# Patient Record
Sex: Male | Born: 1970 | Race: White | Hispanic: No | Smoking: Former smoker
Health system: Southern US, Community
[De-identification: ages and names within clinical notes are randomized; demographics above are authoritative.]

## PROBLEM LIST (undated history)

## (undated) DIAGNOSIS — Z720 Tobacco use: Secondary | ICD-10-CM

## (undated) DIAGNOSIS — E785 Hyperlipidemia, unspecified: Secondary | ICD-10-CM

## (undated) HISTORY — DX: Tobacco use: Z72.0

## (undated) HISTORY — PX: OTHER SURGICAL HISTORY: SHX169

## (undated) HISTORY — DX: Hyperlipidemia, unspecified: E78.5

---
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
## 1998-07-20 HISTORY — PX: OTHER SURGICAL HISTORY: SHX169

## 2006-07-20 HISTORY — PX: OTHER SURGICAL HISTORY: SHX169

## 2009-04-19 ENCOUNTER — Ambulatory Visit: Payer: Self-pay | Admitting: Internal Medicine

## 2009-04-19 DIAGNOSIS — J45909 Unspecified asthma, uncomplicated: Secondary | ICD-10-CM | POA: Insufficient documentation

## 2009-04-19 DIAGNOSIS — F17200 Nicotine dependence, unspecified, uncomplicated: Secondary | ICD-10-CM | POA: Insufficient documentation

## 2009-04-19 DIAGNOSIS — J309 Allergic rhinitis, unspecified: Secondary | ICD-10-CM | POA: Insufficient documentation

## 2009-04-19 DIAGNOSIS — E785 Hyperlipidemia, unspecified: Secondary | ICD-10-CM | POA: Insufficient documentation

## 2009-04-23 ENCOUNTER — Encounter: Payer: Self-pay | Admitting: Internal Medicine

## 2009-10-17 ENCOUNTER — Ambulatory Visit: Payer: Self-pay | Admitting: Internal Medicine

## 2009-10-17 LAB — CONVERTED CEMR LAB
Alkaline Phosphatase: 79 units/L (ref 39–117)
Basophils Relative: 0.5 % (ref 0.0–3.0)
Bilirubin, Direct: 0 mg/dL (ref 0.0–0.3)
CO2: 31 meq/L (ref 19–32)
Calcium: 9.7 mg/dL (ref 8.4–10.5)
Cholesterol: 204 mg/dL — ABNORMAL HIGH (ref 0–200)
Creatinine, Ser: 1 mg/dL (ref 0.4–1.5)
Eosinophils Absolute: 0.3 10*3/uL (ref 0.0–0.7)
GFR calc non Af Amer: 88.39 mL/min (ref >60)
HDL: 44.2 mg/dL (ref >39.00)
Lymphocytes Relative: 32.9 % (ref 12.0–46.0)
MCHC: 35 g/dL (ref 30.0–36.0)
Monocytes Relative: 7.3 % (ref 3.0–12.0)
Neutrophils Relative %: 55.2 % (ref 43.0–77.0)
RBC: 5.13 M/uL (ref 4.22–5.81)
Total CHOL/HDL Ratio: 5
Total Protein: 7.9 g/dL (ref 6.0–8.3)
Triglycerides: 127 mg/dL (ref 0.0–149.0)
VLDL: 25.4 mg/dL (ref 0.0–40.0)
WBC: 7.9 10*3/uL (ref 4.5–10.5)

## 2009-10-18 ENCOUNTER — Telehealth: Payer: Self-pay

## 2010-05-19 ENCOUNTER — Ambulatory Visit: Payer: Self-pay | Admitting: Internal Medicine

## 2010-05-19 DIAGNOSIS — H547 Unspecified visual loss: Secondary | ICD-10-CM | POA: Insufficient documentation

## 2010-08-19 NOTE — Progress Notes (Signed)
Summary: Lab results  Phone Note Call from Patient Call back at [phone]   Caller: Patient Reason for Call: Talk to Nurse, Lab or Test Results Summary of Call: Patient wants latest lipid results.  Initial call taken by: Everrett Coombe,  October 18, 2009 9:52 AM  Follow-up for Phone Call        spoke with pt - reviewd labs , No futher questions. KIK Follow-up by: Duard Brady LPN,  October 18, 2009 10:26 AM

## 2010-08-19 NOTE — Assessment & Plan Note (Signed)
Summary: FU PER PT/PT WILL COME IN FASTING/NJR   Vital Signs:  Patient profile:   40 year old male Weight:      158 pounds Temp:     97.9 degrees F oral BP sitting:   120 / 80  (right arm) Cuff size:   regular  Vitals Entered By: Duard Brady LPN (October 17, 2009 7:59 AM) CC: f/u - fasting for lab , has other things to discuss with doctor Is Patient Diabetic? No   CC:  f/u - fasting for lab  and has other things to discuss with doctor.  History of Present Illness:  40 year old patient who has a history of dyslipidemia, asthma and allergic rhinitis.  He is a one pack per day smoker.  He has a history of premature cardiac disease with his father dying at age 35 of an acute MI.  He is interested in a smoking cessation program.  He is separated from his wife and her cats in his allergy situation has improved.  He still requires medication for symptom control.  His asthma has been stable  Preventive Screening-Counseling & Management  Alcohol-Tobacco     Smoking Status: current  Allergies: 1)  ! Penicillin G Procaine (Penicillin G Procaine)  Past History:  Past Medical History: Reviewed history from 04/19/2009 and no changes required. Allergic rhinitis Asthma Hyperlipidemia tobacco abuse  Past Surgical History: cosmetic facial surgery in 1990 ACL reconstructive surgery 2000 angle Hearne repair 2008 Peyronie's disease   Family History: Reviewed history from 04/19/2009 and no changes required. father died age 68.  Acute MI Paternal Aunt- CVD, DM mother, age 38, history of hypertension no siblings  Social History: Reviewed history from 04/19/2009 and no changes required. 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    Physical Exam  General:  Well-developed,well-nourished,in no acute distress; alert,appropriate and cooperative throughout examination Head:  Normocephalic and atraumatic without obvious abnormalities. No apparent alopecia or balding. Eyes:  No corneal or conjunctival inflammation noted. EOMI. Perrla. Funduscopic exam benign, without hemorrhages, exudates or papilledema. Vision grossly normal. Ears:  External ear exam shows no significant lesions or deformities.  Otoscopic examination reveals clear canals, tympanic membranes are intact bilaterally without bulging, retraction, inflammation or discharge. Hearing is grossly normal bilaterally. Mouth:  Oral mucosa and oropharynx without lesions or exudates.  Teeth in good repair. Neck:  No deformities, masses, or tenderness noted. Chest Wall:  No deformities, masses, tenderness or gynecomastia noted. Lungs:  Normal respiratory effort, chest expands symmetrically. Lungs are clear to auscultation, no crackles or wheezes. Heart:  Normal rate and regular rhythm. S1 and S2 normal without gallop, murmur, click, rub or other extra sounds. Abdomen:  Bowel sounds positive,abdomen soft and non-tender without masses, organomegaly or hernias noted. Msk:  No deformity or scoliosis noted of thoracic or lumbar spine.   Pulses:  R and L carotid,radial,femoral,dorsalis pedis and posterior tibial pulses are full and equal bilaterally Extremities:  No clubbing, cyanosis, edema, or deformity noted with normal full range of motion of all joints.   Skin:  Intact without suspicious lesions or  rashes Psych:  Cognition and  judgment appear intact. Alert and cooperative with normal attention span and concentration. No apparent delusions, illusions, hallucinations   Impression & Recommendations:  Problem # 1:  TOBACCO ABUSE (ICD-305.1)  His updated medication list for this problem includes:    Chantix Starting Month Pak 0.5 Mg X 11 & 1 Mg X 42 Tabs (Varenicline tartrate) .Marland Kitchen... As directed    Chantix 1 Mg Tabs (Varenicline tartrate) ..... One twice daily  His updated medication list for this problem includes:    Chantix Starting Month Pak 0.5 Mg X 11 & 1 Mg X 42 Tabs (Varenicline tartrate) .Marland Kitchen... As directed    Chantix 1 Mg Tabs (Varenicline tartrate) ..... One twice daily  Orders: TLB-BMP (Basic Metabolic Panel-BMET) (80048-METABOL) TLB-CBC Platelet - w/Differential (85025-CBCD) TLB-Hepatic/Liver Function Pnl (80076-HEPATIC)  Problem # 2:  HYPERLIPIDEMIA (ICD-272.4)  Orders: Venipuncture (03474) TLB-Lipid Panel (80061-LIPID) TLB-BMP (Basic Metabolic Panel-BMET) (80048-METABOL) TLB-CBC Platelet - w/Differential (85025-CBCD) TLB-Hepatic/Liver Function Pnl (80076-HEPATIC)  Problem # 3:  ASTHMA (ICD-493.90)  His updated medication list for this problem includes:    Singulair 10 Mg Tabs (Montelukast sodium) ..... One daily    Proair Hfa 108 (90 Base) Mcg/act Aers (Albuterol sulfate) .Marland Kitchen..Marland Kitchen Two puffs every 6 hours as needed for wheezing  His updated medication list for this problem includes:    Singulair 10 Mg Tabs (Montelukast sodium) ..... One daily    Proair Hfa 108 (90 Base) Mcg/act Aers (Albuterol sulfate) .Marland Kitchen..Marland Kitchen Two puffs every 6 hours as needed for wheezing  Orders: TLB-BMP (Basic Metabolic Panel-BMET) (80048-METABOL) TLB-CBC Platelet - w/Differential (85025-CBCD) TLB-Hepatic/Liver Function Pnl (80076-HEPATIC)  Problem # 4:  ALLERGIC RHINITIS (ICD-477.9)  Orders: TLB-BMP (Basic Metabolic Panel-BMET) (80048-METABOL) TLB-CBC Platelet - w/Differential (85025-CBCD) TLB-Hepatic/Liver Function Pnl  (80076-HEPATIC)  Complete Medication List: 1)  Singulair 10 Mg Tabs (Montelukast sodium) .... One daily 2)  Proair Hfa 108 (90 Base) Mcg/act Aers (Albuterol sulfate) .... Two puffs every 6 hours as needed for wheezing 3)  Chantix Starting Month Pak 0.5 Mg X 11 & 1 Mg X 42 Tabs (Varenicline tartrate) .... As directed 4)  Chantix 1 Mg Tabs (Varenicline tartrate) .... One twice daily  Other Orders: T-HIV Antibody  (Reflex) ([id number])  Patient Instructions: 1)  Please schedule a follow-up appointment in 1 year. 2)  It is important that you exercise regularly at least 20 minutes 5 times a week. If you develop chest pain, have severe difficulty breathing, or feel very tired , stop exercising immediately and seek medical attention. Prescriptions: CHANTIX 1 MG TABS (VARENICLINE TARTRATE) one twice daily  #60 x 2   Entered and Authorized by:   Gordy Savers  MD   Signed by:   Gordy Savers  MD on 10/17/2009   Method used:   Print then Give to Patient   RxID:   [id number] CHANTIX STARTING MONTH PAK 0.5 MG X 11 & 1 MG X 42 TABS (VARENICLINE TARTRATE) as directed  #one month x 0   Entered and Authorized by:   Gordy Savers  MD   Signed by:   Gordy Savers  MD on 10/17/2009   Method used:   Print then Give to Patient   RxID:   [id number] PROAIR HFA 108 (90 BASE) MCG/ACT AERS (ALBUTEROL SULFATE) two puffs every 6 hours as needed for wheezing  #3 x 6   Entered and Authorized by:   Gordy Savers  MD   Signed by:   Gordy Savers  MD on  10/17/2009   Method used:   Print then Give to Patient   RxID:   [id number] SINGULAIR 10 MG TABS (MONTELUKAST SODIUM) one daily  #90 x 6   Entered and Authorized by:   Gordy Savers  MD   Signed by:   Gordy Savers  MD on 10/17/2009   Method used:   Print then Give to Patient   RxID:   [id number]

## 2010-08-19 NOTE — Assessment & Plan Note (Signed)
Summary: consult re: eye issues/med check/flu shot/cjr   Vital Signs:  Patient profile:   40 year old male Weight:      185 pounds BMI:     28.23 Temp:     98.8 degrees F oral BP sitting:   132 / 94  (left arm) Cuff size:   regular  Vitals Entered By: Alfred Levins, CMA (May 19, 2010 9:59 AM) CC: floater, lt eye, renew meds   CC:  floater, lt eye, and renew meds.  History of Present Illness: 40 year old patient who has a history of erectile dysfunction, as well as asthma.  For the past week.  He has had some change in visual acuity or involving  the lateral aspect of the left visual field.  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  He states it is headache.  History of astigmatism, but has had no recent eye examination in some time  Preventive Screening-Counseling & Management  Alcohol-Tobacco     Smoking Cessation Counseling: yes  Current Medications (verified): 1)  Singulair 10 Mg Tabs (Montelukast Sodium) .... One Daily 2)  Proair Hfa 108 (90 Base) Mcg/act Aers (Albuterol Sulfate) .... Two Puffs Every 6 Hours As Needed For Wheezing  Allergies (verified): 1)  ! Penicillin G Procaine (Penicillin G Procaine)  Past History:  Past Medical History: Reviewed history from 04/19/2009 and no changes required. Allergic rhinitis Asthma Hyperlipidemia tobacco abuse  Review of Systems       The patient complains of vision loss.  The patient denies anorexia, fever, weight loss, weight gain, decreased hearing, hoarseness, chest pain, syncope, dyspnea on exertion, peripheral edema, prolonged cough, headaches, hemoptysis, abdominal pain, melena, hematochezia, severe indigestion/heartburn, hematuria, incontinence, genital sores, muscle weakness, suspicious skin lesions, transient blindness, difficulty walking, depression, unusual weight change, abnormal bleeding, enlarged lymph nodes, angioedema, breast masses, and testicular masses.    Physical Exam  General:   Well-developed,well-nourished,in no acute distress; alert,appropriate and cooperative throughout examination Head:  Normocephalic and atraumatic without obvious abnormalities. No apparent alopecia or balding. Eyes:  No corneal or conjunctival inflammation noted. EOMI. Perrla. Funduscopic exam benign, without hemorrhages, exudates or papilledema. Vision grossly normal.  visual acuity 2025 on the left and 2020 on the right   Impression & Recommendations:  Problem # 1:  TOBACCO ABUSE (ICD-305.1)  The following medications were removed from the medication list:    Chantix Starting Month Pak 0.5 Mg X 11 & 1 Mg X 42 Tabs (Varenicline tartrate) .Marland Kitchen... As directed    Chantix 1 Mg Tabs (Varenicline tartrate) ..... One twice daily  Problem # 2:  ASTHMA (ICD-493.90)  His updated medication list for this problem includes:    Singulair 10 Mg Tabs (Montelukast sodium) ..... One daily    Proair Hfa 108 (90 Base) Mcg/act Aers (Albuterol sulfate) .Marland Kitchen..Marland Kitchen Two puffs every 6 hours as needed for wheezing  Problem # 3:  UNSPECIFIED VISUAL LOSS (ICD-369.9)  Orders: Ophthalmology Referral (Ophthalmology)  Complete Medication List: 1)  Singulair 10 Mg Tabs (Montelukast sodium) .... One daily 2)  Proair Hfa 108 (90 Base) Mcg/act Aers (Albuterol sulfate) .... Two puffs every 6 hours as needed for wheezing 3)  Cialis 20 Mg Tabs (Tadalafil) .... One daily as directed  Other Orders: Admin 1st Vaccine (60454) Flu Vaccine 55yrs + [phone])  Patient Instructions: 1)  Tobacco is very bad for your health and your loved ones! You Should stop smoking!. 2)  ophthalmology evaluation as scheduled Prescriptions: PROAIR HFA 108 (90 BASE) MCG/ACT AERS (ALBUTEROL SULFATE) two  puffs every 6 hours as needed for wheezing  #3 x 6   Entered and Authorized by:   Gordy Savers  MD   Signed by:   Gordy Savers  MD on 05/19/2010   Method used:   Print then Give to Patient   RxID:   [id number] SINGULAIR 10 MG TABS  (MONTELUKAST SODIUM) one daily  #90 x 6   Entered and Authorized by:   Gordy Savers  MD   Signed by:   Gordy Savers  MD on 05/19/2010   Method used:   Print then Give to Patient   RxID:   [id number] CIALIS 20 MG TABS (TADALAFIL) one daily as directed  #12 x 6   Entered and Authorized by:   Gordy Savers  MD   Signed by:   Gordy Savers  MD on 05/19/2010   Method used:   Print then Give to Patient   RxID:   [id number]  Flu Vaccine Consent Questions     Do you have a history of severe allergic reactions to this vaccine? no    Any prior history of allergic reactions to egg and/or gelatin? no    Do you have a sensitivity to the preservative Thimersol? no    Do you have a past history of Guillan-Barre Syndrome? no    Do you currently have an acute febrile illness? no    Have you ever had a severe reaction to latex? no    Vaccine information given and explained to patient? yes    Are you currently pregnant? no    Lot Number:[id number]   Exp Date:01/17/2011   Site Given  Left Deltoid IM   Orders Added: 1)  Admin 1st Vaccine [90471] 2)  Flu Vaccine 55yrs + [01027] 3)  Est. Patient Level III [25366] 4)  Ophthalmology Referral [Ophthalmology]   .[email or web address]

## 2010-08-27 ENCOUNTER — Telehealth: Payer: Self-pay | Admitting: Internal Medicine

## 2010-08-27 DIAGNOSIS — Z720 Tobacco use: Secondary | ICD-10-CM

## 2010-08-27 NOTE — Telephone Encounter (Signed)
Wants a new rx for Chantix. Please send to [facility] [address]

## 2010-08-27 NOTE — Telephone Encounter (Signed)
Please advise 

## 2010-08-28 ENCOUNTER — Telehealth: Payer: Self-pay

## 2010-08-28 MED ORDER — VARENICLINE TARTRATE 1 MG PO TABS: 1.0000 mg | ORAL_TABLET | Freq: Two times a day (BID) | ORAL | Status: AC

## 2010-08-28 MED ORDER — VARENICLINE TARTRATE 0.5 MG X 11 & 1 MG X 42 PO MISC: ORAL | Status: AC

## 2010-08-28 NOTE — Telephone Encounter (Signed)
ok 

## 2010-08-29 NOTE — Telephone Encounter (Signed)
rx order done in other encounter

## 2011-03-24 ENCOUNTER — Ambulatory Visit (INDEPENDENT_AMBULATORY_CARE_PROVIDER_SITE_OTHER): Payer: BC Managed Care – PPO | Admitting: Internal Medicine

## 2011-03-24 DIAGNOSIS — Z0000 Encounter for general adult medical examination without abnormal findings: Secondary | ICD-10-CM

## 2011-03-24 DIAGNOSIS — Z23 Encounter for immunization: Secondary | ICD-10-CM

## 2011-05-20 ENCOUNTER — Other Ambulatory Visit: Payer: Self-pay | Admitting: Internal Medicine

## 2011-05-20 NOTE — Telephone Encounter (Signed)
Will need to be seen for future refills - last seen 04/2010

## 2011-06-01 ENCOUNTER — Ambulatory Visit (INDEPENDENT_AMBULATORY_CARE_PROVIDER_SITE_OTHER): Payer: BC Managed Care – PPO | Admitting: Internal Medicine

## 2011-06-01 DIAGNOSIS — Z0000 Encounter for general adult medical examination without abnormal findings: Secondary | ICD-10-CM

## 2011-06-01 DIAGNOSIS — Z23 Encounter for immunization: Secondary | ICD-10-CM

## 2011-08-07 ENCOUNTER — Other Ambulatory Visit: Payer: Self-pay | Admitting: Internal Medicine

## 2011-09-22 ENCOUNTER — Other Ambulatory Visit: Payer: Self-pay | Admitting: Internal Medicine

## 2011-09-24 ENCOUNTER — Other Ambulatory Visit: Payer: Self-pay | Admitting: Internal Medicine

## 2011-09-24 MED ORDER — MONTELUKAST SODIUM 10 MG PO TABS: 10.0000 mg | ORAL_TABLET | Freq: Every day | ORAL | Status: DC

## 2011-09-24 NOTE — Telephone Encounter (Signed)
Pt received only 15 pills singulair. Pt is unable to come in until 10-13-2011. Pt would like 15 more pills call into [facility] [address] [phone]

## 2011-10-13 ENCOUNTER — Encounter: Payer: Self-pay | Admitting: Internal Medicine

## 2011-10-13 ENCOUNTER — Ambulatory Visit (INDEPENDENT_AMBULATORY_CARE_PROVIDER_SITE_OTHER): Payer: BC Managed Care – PPO | Admitting: Internal Medicine

## 2011-10-13 VITALS — BP 120/82 | Temp 98.7°F | Ht 68.0 in | Wt 195.0 lb

## 2011-10-13 DIAGNOSIS — F17200 Nicotine dependence, unspecified, uncomplicated: Secondary | ICD-10-CM

## 2011-10-13 DIAGNOSIS — J309 Allergic rhinitis, unspecified: Secondary | ICD-10-CM

## 2011-10-13 DIAGNOSIS — J45909 Unspecified asthma, uncomplicated: Secondary | ICD-10-CM

## 2011-10-13 MED ORDER — MONTELUKAST SODIUM 10 MG PO TABS: 10.0000 mg | ORAL_TABLET | Freq: Every day | ORAL | Status: DC

## 2011-10-13 MED ORDER — ALBUTEROL SULFATE HFA 108 (90 BASE) MCG/ACT IN AERS: 2.0000 | INHALATION_SPRAY | Freq: Four times a day (QID) | RESPIRATORY_TRACT | Status: DC | PRN

## 2011-10-13 MED ORDER — BUDESONIDE 180 MCG/ACT IN AEPB: 1.0000 | INHALATION_SPRAY | Freq: Two times a day (BID) | RESPIRATORY_TRACT | Status: DC

## 2011-10-13 NOTE — Patient Instructions (Signed)
Smoking tobacco is very bad for your health. You should stop smoking immediately.    It is important that you exercise regularly, at least 20 minutes 3 to 4 times per week.  If you develop chest pain or shortness of breath seek  medical attention.  Pulmicort 2 puffs twice daily for one week then decrease to one puff twice daily

## 2011-10-13 NOTE — Progress Notes (Signed)
  Subjective:    Patient ID: Roy Griffin, male    DOB: January 04, 1971, 41 y.o.   MRN: [id number]  HPI  41 year old patient who has a history of asthma and allergic rhinitis as well as ongoing tobacco use. He uses one half pack of cigarettes daily. He is doing reasonably well. He remains on Singulair maintenance but does require albuterol twice a day for symptom control. He has some mild asthma 2 months ago associated with a mild viral URI. Today he feels well    Review of Systems  Constitutional: Negative for fever, chills, appetite change and fatigue.  HENT: Negative for hearing loss, ear pain, congestion, sore throat, trouble swallowing, neck stiffness, dental problem, voice change and tinnitus.   Eyes: Negative for pain, discharge and visual disturbance.  Respiratory: Positive for wheezing. Negative for cough, chest tightness and stridor.   Cardiovascular: Negative for chest pain, palpitations and leg swelling.  Gastrointestinal: Negative for nausea, vomiting, abdominal pain, diarrhea, constipation, blood in stool and abdominal distention.  Genitourinary: Negative for urgency, hematuria, flank pain, discharge, difficulty urinating and genital sores.  Musculoskeletal: Negative for myalgias, back pain, joint swelling, arthralgias and gait problem.  Skin: Negative for rash.  Neurological: Negative for dizziness, syncope, speech difficulty, weakness, numbness and headaches.  Hematological: Negative for adenopathy. Does not bruise/bleed easily.  Psychiatric/Behavioral: Negative for behavioral problems and dysphoric mood. The patient is not nervous/anxious.        Objective:   Physical Exam  Constitutional: He is oriented to person, place, and time. He appears well-developed.  HENT:  Head: Normocephalic.  Right Ear: External ear normal.  Left Ear: External ear normal.  Eyes: Conjunctivae and EOM are normal.  Neck: Normal range of motion.  Cardiovascular: Normal rate and normal heart sounds.     Pulmonary/Chest: Breath sounds normal.  Abdominal: Bowel sounds are normal.  Musculoskeletal: Normal range of motion. He exhibits no edema and no tenderness.  Neurological: He is alert and oriented to person, place, and time.  Psychiatric: He has a normal mood and affect. His behavior is normal.          Assessment & Plan:    Asthma. We'll continue Singulair but add Pulmicort and hopefully to decrease albuterol use to when necessary only. Cessation of smoking encouraged

## 2012-05-31 ENCOUNTER — Encounter: Payer: Self-pay | Admitting: Internal Medicine

## 2012-05-31 ENCOUNTER — Ambulatory Visit (INDEPENDENT_AMBULATORY_CARE_PROVIDER_SITE_OTHER): Payer: BC Managed Care – PPO | Admitting: Internal Medicine

## 2012-05-31 VITALS — BP 134/80 | HR 76 | Temp 98.4°F | Resp 16 | Wt 197.0 lb

## 2012-05-31 DIAGNOSIS — Z23 Encounter for immunization: Secondary | ICD-10-CM

## 2012-05-31 DIAGNOSIS — R109 Unspecified abdominal pain: Secondary | ICD-10-CM

## 2012-05-31 DIAGNOSIS — R1030 Lower abdominal pain, unspecified: Secondary | ICD-10-CM

## 2012-05-31 NOTE — Progress Notes (Signed)
  Subjective:    Patient ID: Roy Griffin, male    DOB: Jun 22, 1971, 41 y.o.   MRN: [id number]  HPI  41 year old patient who has a history of a prior right inguinal hernia repair. For the past the week he has had some discomfort in the right groin area. He also complains of discomfort over the coccyx region and the left buttock region that has been intermittent for the past 2 months    Review of Systems  Musculoskeletal: Positive for back pain.       Objective:   Physical Exam  Constitutional: He appears well-developed and well-nourished. No distress.  Abdominal: Soft. Bowel sounds are normal. He exhibits no distension and no mass. There is no tenderness. There is no rebound and no guarding.  Musculoskeletal: Normal range of motion. He exhibits no edema and no tenderness.          Assessment & Plan:   Right  inguinal pain pain in the low back and left buttock region. Suspect musculoligamentous. Patient will try anti-inflammatories and will observe. He was reassured. He'll call there is any worsening symptoms

## 2012-05-31 NOTE — Patient Instructions (Signed)
Take Aleve 200 mg twice daily for pain or swelling  Call or return to clinic prn if these symptoms worsen or fail to improve as anticipated.  

## 2012-08-29 ENCOUNTER — Other Ambulatory Visit: Payer: Self-pay | Admitting: Internal Medicine

## 2012-08-29 NOTE — Telephone Encounter (Signed)
Patient called stating that he would like a refill of his cialis 20mg  1poqd prn sent to [facility] on [address]. Please assist.

## 2012-08-30 MED ORDER — TADALAFIL 20 MG PO TABS: 10.0000 mg | ORAL_TABLET | ORAL | Status: DC | PRN

## 2012-11-18 ENCOUNTER — Other Ambulatory Visit: Payer: Self-pay | Admitting: Internal Medicine

## 2013-08-12 ENCOUNTER — Other Ambulatory Visit: Payer: Self-pay | Admitting: Internal Medicine

## 2013-10-15 ENCOUNTER — Other Ambulatory Visit: Payer: Self-pay | Admitting: Internal Medicine

## 2013-10-18 ENCOUNTER — Other Ambulatory Visit: Payer: Self-pay | Admitting: Internal Medicine

## 2013-11-13 ENCOUNTER — Encounter: Payer: Self-pay | Admitting: Internal Medicine

## 2013-11-13 ENCOUNTER — Ambulatory Visit (INDEPENDENT_AMBULATORY_CARE_PROVIDER_SITE_OTHER): Payer: 59 | Admitting: Internal Medicine

## 2013-11-13 VITALS — BP 122/90 | HR 79 | Temp 98.8°F | Resp 20 | Ht 68.0 in | Wt 196.0 lb

## 2013-11-13 DIAGNOSIS — E785 Hyperlipidemia, unspecified: Secondary | ICD-10-CM

## 2013-11-13 DIAGNOSIS — J45909 Unspecified asthma, uncomplicated: Secondary | ICD-10-CM

## 2013-11-13 DIAGNOSIS — F17200 Nicotine dependence, unspecified, uncomplicated: Secondary | ICD-10-CM

## 2013-11-13 MED ORDER — TADALAFIL 20 MG PO TABS: 10.0000 mg | ORAL_TABLET | ORAL | Status: DC | PRN

## 2013-11-13 MED ORDER — ALBUTEROL SULFATE HFA 108 (90 BASE) MCG/ACT IN AERS: INHALATION_SPRAY | RESPIRATORY_TRACT | Status: DC

## 2013-11-13 NOTE — Progress Notes (Signed)
Subjective:    Patient ID: Roy Griffin, male    DOB: June 06, 1971, 43 y.o.   MRN: [id number]  HPI 43 year old  patient has a history of allergic rhinitis and asthma.  He is a former smoker, but has been abstinent for about one year.  He has been on maintenance Singulair and Pulmicort in the past, but presently is using albuterol only.  He is a difficult spring with frequent albuterol use  Social history- will graduate school in approximately 2 weeks  Past Medical History  Diagnosis Date  . Allergic rhinitis   . Asthma   . Hyperlipidemia   . Tobacco abuse     History   Social History  . Marital Status: Married    Spouse Name: N/A    Number of Children: N/A  . Years of Education: N/A   Occupational History  . Not on file.   Social History Main Topics  . Smoking status: Current Every Day Smoker -- 0.50 packs/day    Types: Cigarettes  . Smokeless tobacco: Never Used  . Alcohol Use: Yes  . Drug Use: No  . Sexual Activity: Not on file   Other Topics Concern  . Not on file   Social History Narrative  . No narrative on file    Past Surgical History  Procedure Laterality Date  . Facial cosmetic surgery  1990  . Acl reconstructive surgery  2000  . Angle hearne repair  2008  . Peyronie's disease      Family History  Problem Relation Age of Onset  . Hypertension Mother   . Heart attack Father   . Diabetes Paternal Aunt     Allergies  Allergen Reactions  . Penicillins     No current outpatient prescriptions on file prior to visit.   No current facility-administered medications on file prior to visit.    BP 122/90  Pulse 79  Temp(Src) 98.8 F (37.1 C) (Oral)  Resp 20  Ht 5\' 8"  (1.727 m)  Wt 196 lb (88.905 kg)  BMI 29.81 kg/m2  SpO2 97%       Review of Systems  Constitutional: Negative for fever, chills, appetite change and fatigue.  HENT: Negative for congestion, dental problem, ear pain, hearing loss, sore throat, tinnitus, trouble swallowing and  voice change.   Eyes: Negative for pain, discharge and visual disturbance.  Respiratory: Positive for shortness of breath and wheezing. Negative for cough, chest tightness and stridor.   Cardiovascular: Negative for chest pain, palpitations and leg swelling.  Gastrointestinal: Negative for nausea, vomiting, abdominal pain, diarrhea, constipation, blood in stool and abdominal distention.  Genitourinary: Negative for urgency, hematuria, flank pain, discharge, difficulty urinating and genital sores.  Musculoskeletal: Negative for arthralgias, back pain, gait problem, joint swelling, myalgias and neck stiffness.  Skin: Negative for rash.  Neurological: Negative for dizziness, syncope, speech difficulty, weakness, numbness and headaches.  Hematological: Negative for adenopathy. Does not bruise/bleed easily.  Psychiatric/Behavioral: Negative for behavioral problems and dysphoric mood. The patient is not nervous/anxious.        Objective:   Physical Exam  Constitutional: He is oriented to person, place, and time. He appears well-developed.  HENT:  Head: Normocephalic.  Right Ear: External ear normal.  Left Ear: External ear normal.  Eyes: Conjunctivae and EOM are normal.  Neck: Normal range of motion.  Cardiovascular: Normal rate and normal heart sounds.   Pulmonary/Chest: Effort normal and breath sounds normal. He has no wheezes.  Abdominal: Bowel sounds are normal.  Musculoskeletal: Normal range of motion. He exhibits no edema and no tenderness.  Neurological: He is alert and oriented to person, place, and time.  Psychiatric: He has a normal mood and affect. His behavior is normal.          Assessment & Plan:   Allergic rhinitis Asthma  Medications refilled Will resume Pulmicort at least until early summer.  Schedule CPX

## 2013-11-13 NOTE — Progress Notes (Signed)
Pre-visit discussion using our clinic review tool. No additional management support is needed unless otherwise documented below in the visit note.  

## 2013-11-13 NOTE — Patient Instructions (Signed)
It is important that you exercise regularly, at least 20 minutes 3 to 4 times per week.  If you develop chest pain or shortness of breath seek  medical attention.  Return in one year for follow-up   

## 2013-11-14 ENCOUNTER — Telehealth: Payer: Self-pay | Admitting: Internal Medicine

## 2013-11-14 NOTE — Telephone Encounter (Signed)
Relevant patient education mailed to patient.  

## 2014-03-05 ENCOUNTER — Telehealth: Payer: Self-pay | Admitting: Internal Medicine

## 2014-03-05 MED ORDER — BUDESONIDE 180 MCG/ACT IN AEPB: 2.0000 | INHALATION_SPRAY | Freq: Two times a day (BID) | RESPIRATORY_TRACT | Status: DC

## 2014-03-05 NOTE — Telephone Encounter (Signed)
Please advise dosage of Pulmicort?

## 2014-03-05 NOTE — Telephone Encounter (Signed)
Pt was given a sample of ?pulmicort and needs new rx sent to [facility] [address]

## 2014-03-05 NOTE — Telephone Encounter (Signed)
Pt notified Rx sent to pharmacy

## 2014-03-05 NOTE — Telephone Encounter (Signed)
Pulmicort Flexhaler 180 2 puffs twice daily

## 2014-03-08 ENCOUNTER — Telehealth: Payer: Self-pay | Admitting: Internal Medicine

## 2014-03-08 MED ORDER — BECLOMETHASONE DIPROPIONATE 80 MCG/ACT IN AERS: 1.0000 | INHALATION_SPRAY | Freq: Two times a day (BID) | RESPIRATORY_TRACT | Status: DC

## 2014-03-08 NOTE — Telephone Encounter (Signed)
Please see message and advise 

## 2014-03-08 NOTE — Telephone Encounter (Signed)
I received a PA request for budesonide (PULMICORT) 180 MCG/ACT inhaler.  I spoke to pt's plan and was advised the pt must try and fail one of the following:  Asmanex, QVAR, or Flovent HFA

## 2014-03-08 NOTE — Telephone Encounter (Signed)
Left message on voicemail to call office.  

## 2014-03-08 NOTE — Telephone Encounter (Signed)
Qvar 80 one puff twice daily

## 2014-03-08 NOTE — Telephone Encounter (Signed)
Pt notified Rx for Qvar sent to pharmacy due to insurance would not cover Pulmicort.

## 2014-10-23 ENCOUNTER — Other Ambulatory Visit: Payer: 59

## 2014-10-30 ENCOUNTER — Encounter: Payer: 59 | Admitting: Internal Medicine

## 2014-11-16 ENCOUNTER — Other Ambulatory Visit: Payer: 59

## 2014-11-23 ENCOUNTER — Encounter: Payer: 59 | Admitting: Internal Medicine

## 2015-01-04 ENCOUNTER — Other Ambulatory Visit (INDEPENDENT_AMBULATORY_CARE_PROVIDER_SITE_OTHER): Payer: 59

## 2015-01-04 DIAGNOSIS — Z0000 Encounter for general adult medical examination without abnormal findings: Secondary | ICD-10-CM

## 2015-01-04 LAB — CBC WITH DIFFERENTIAL/PLATELET
BASOS PCT: 1 % (ref 0.0–3.0)
Basophils Absolute: 0.1 10*3/uL (ref 0.0–0.1)
EOS ABS: 0.4 10*3/uL (ref 0.0–0.7)
Eosinophils Relative: 7.7 % — ABNORMAL HIGH (ref 0.0–5.0)
HCT: 47.6 % (ref 39.0–52.0)
Hemoglobin: 16.2 g/dL (ref 13.0–17.0)
LYMPHS ABS: 1.9 10*3/uL (ref 0.7–4.0)
Lymphocytes Relative: 34.2 % (ref 12.0–46.0)
MCHC: 34 g/dL (ref 30.0–36.0)
MCV: 89.7 fl (ref 78.0–100.0)
Monocytes Absolute: 0.4 10*3/uL (ref 0.1–1.0)
Monocytes Relative: 7.6 % (ref 3.0–12.0)
Neutro Abs: 2.8 10*3/uL (ref 1.4–7.7)
Neutrophils Relative %: 49.5 % (ref 43.0–77.0)
Platelets: 240 10*3/uL (ref 150.0–400.0)
RBC: 5.31 Mil/uL (ref 4.22–5.81)
RDW: 12.8 % (ref 11.5–15.5)
WBC: 5.6 10*3/uL (ref 4.0–10.5)

## 2015-01-04 LAB — POCT URINALYSIS DIPSTICK
BILIRUBIN UA: NEGATIVE
Glucose, UA: NEGATIVE
Ketones, UA: NEGATIVE
Leukocytes, UA: NEGATIVE
Nitrite, UA: NEGATIVE
Protein, UA: NEGATIVE
RBC UA: NEGATIVE
SPEC GRAV UA: 1.01
Urobilinogen, UA: 0.2
pH, UA: 6

## 2015-01-04 LAB — BASIC METABOLIC PANEL
BUN: 11 mg/dL (ref 6–23)
CHLORIDE: 101 meq/L (ref 96–112)
CO2: 31 mEq/L (ref 19–32)
Calcium: 9.2 mg/dL (ref 8.4–10.5)
Creatinine, Ser: 1.04 mg/dL (ref 0.40–1.50)
GFR: 82.36 mL/min (ref >60.00)
Glucose, Bld: 92 mg/dL (ref 70–99)
POTASSIUM: 4.6 meq/L (ref 3.5–5.1)
SODIUM: 135 meq/L (ref 135–145)

## 2015-01-04 LAB — HEPATIC FUNCTION PANEL
ALT: 30 U/L (ref 0–53)
AST: 28 U/L (ref 0–37)
Albumin: 4.3 g/dL (ref 3.5–5.2)
Alkaline Phosphatase: 76 U/L (ref 39–117)
BILIRUBIN TOTAL: 0.4 mg/dL (ref 0.2–1.2)
Bilirubin, Direct: 0.1 mg/dL (ref 0.0–0.3)
TOTAL PROTEIN: 6.9 g/dL (ref 6.0–8.3)

## 2015-01-04 LAB — LIPID PANEL
CHOL/HDL RATIO: 7
Cholesterol: 230 mg/dL — ABNORMAL HIGH (ref 0–200)
HDL: 33 mg/dL — AB (ref >39.00)
LDL Cholesterol: 169 mg/dL — ABNORMAL HIGH (ref 0–99)
NONHDL: 197
TRIGLYCERIDES: 140 mg/dL (ref 0.0–149.0)
VLDL: 28 mg/dL (ref 0.0–40.0)

## 2015-01-04 LAB — TSH: TSH: 2.15 u[IU]/mL (ref 0.35–4.50)

## 2015-01-11 ENCOUNTER — Encounter: Payer: Self-pay | Admitting: Internal Medicine

## 2015-01-11 ENCOUNTER — Telehealth: Payer: Self-pay | Admitting: Internal Medicine

## 2015-01-11 ENCOUNTER — Ambulatory Visit (INDEPENDENT_AMBULATORY_CARE_PROVIDER_SITE_OTHER): Payer: 59 | Admitting: Internal Medicine

## 2015-01-11 VITALS — BP 120/76 | HR 86 | Temp 98.5°F | Resp 20 | Ht 67.5 in | Wt 191.0 lb

## 2015-01-11 DIAGNOSIS — Z0000 Encounter for general adult medical examination without abnormal findings: Secondary | ICD-10-CM

## 2015-01-11 DIAGNOSIS — J4520 Mild intermittent asthma, uncomplicated: Secondary | ICD-10-CM

## 2015-01-11 DIAGNOSIS — E785 Hyperlipidemia, unspecified: Secondary | ICD-10-CM

## 2015-01-11 MED ORDER — TRIAMCINOLONE ACETONIDE 0.1 % EX CREA: 1.0000 "application " | TOPICAL_CREAM | Freq: Two times a day (BID) | CUTANEOUS | Status: DC

## 2015-01-11 MED ORDER — BUDESONIDE 180 MCG/ACT IN AEPB: 2.0000 | INHALATION_SPRAY | Freq: Two times a day (BID) | RESPIRATORY_TRACT | Status: DC

## 2015-01-11 MED ORDER — TADALAFIL 20 MG PO TABS: 10.0000 mg | ORAL_TABLET | ORAL | Status: DC | PRN

## 2015-01-11 MED ORDER — ALBUTEROL SULFATE HFA 108 (90 BASE) MCG/ACT IN AERS: INHALATION_SPRAY | RESPIRATORY_TRACT | Status: DC

## 2015-01-11 NOTE — Patient Instructions (Signed)
It is important that you exercise regularly, at least 20 minutes 3 to 4 times per week.  If you develop chest pain or shortness of breath seek  medical attention.  Fat and Cholesterol Control Diet Fat and cholesterol levels in your blood and organs are influenced by your diet. High levels of fat and cholesterol may lead to diseases of the heart, small and large blood vessels, gallbladder, liver, and pancreas. CONTROLLING FAT AND CHOLESTEROL WITH DIET Although exercise and lifestyle factors are important, your diet is key. That is because certain foods are known to raise cholesterol and others to lower it. The goal is to balance foods for their effect on cholesterol and more importantly, to replace saturated and trans fat with other types of fat, such as monounsaturated fat, polyunsaturated fat, and omega-3 fatty acids. On average, a person should consume no more than 15 to 17 g of saturated fat daily. Saturated and trans fats are considered "bad" fats, and they will raise LDL cholesterol. Saturated fats are primarily found in animal products such as meats, butter, and cream. However, that does not mean you need to give up all your favorite foods. Today, there are good tasting, low-fat, low-cholesterol substitutes for most of the things you like to eat. Choose low-fat or nonfat alternatives. Choose round or loin cuts of red meat. These types of cuts are lowest in fat and cholesterol. Chicken (without the skin), fish, veal, and ground [place] breast are great choices. Eliminate fatty meats, such as hot dogs and salami. Even shellfish have little or no saturated fat. Have a 3 oz (85 g) portion when you eat lean meat, poultry, or fish. Trans fats are also called "partially hydrogenated oils." They are oils that have been scientifically manipulated so that they are solid at room temperature resulting in a longer shelf life and improved taste and texture of foods in which they are added. Trans fats are found in  stick margarine, some tub margarines, cookies, crackers, and baked goods.  When baking and cooking, oils are a great substitute for butter. The monounsaturated oils are especially beneficial since it is believed they lower LDL and raise HDL. The oils you should avoid entirely are saturated tropical oils, such as coconut and palm.  Remember to eat a lot from food groups that are naturally free of saturated and trans fat, including fish, fruit, vegetables, beans, grains (barley, rice, couscous, bulgur wheat), and pasta (without cream sauces).  IDENTIFYING FOODS THAT LOWER FAT AND CHOLESTEROL  Soluble fiber may lower your cholesterol. This type of fiber is found in fruits such as apples, vegetables such as broccoli, potatoes, and carrots, legumes such as beans, peas, and lentils, and grains such as barley. Foods fortified with plant sterols (phytosterol) may also lower cholesterol. You should eat at least 2 g per day of these foods for a cholesterol lowering effect.  Read package labels to identify low-saturated fats, trans fat free, and low-fat foods at the supermarket. Select cheeses that have only 2 to 3 g saturated fat per ounce. Use a heart-healthy tub margarine that is free of trans fats or partially hydrogenated oil. When buying baked goods (cookies, crackers), avoid partially hydrogenated oils. Breads and muffins should be made from whole grains (whole-wheat or whole oat flour, instead of "flour" or "enriched flour"). Buy non-creamy canned soups with reduced salt and no added fats.  FOOD PREPARATION TECHNIQUES  Never deep-fry. If you must fry, either stir-fry, which uses very little fat, or use non-stick cooking sprays.  When possible, broil, bake, or roast meats, and steam vegetables. Instead of putting butter or margarine on vegetables, use lemon and herbs, applesauce, and cinnamon (for squash and sweet potatoes). Use nonfat yogurt, salsa, and low-fat dressings for salads.  LOW-SATURATED FAT / LOW-FAT  FOOD SUBSTITUTES Meats / Saturated Fat (g)  Avoid: Steak, marbled (3 oz/85 g) / 11 g  Choose: Steak, lean (3 oz/85 g) / 4 g  Avoid: Hamburger (3 oz/85 g) / 7 g  Choose: Hamburger, lean (3 oz/85 g) / 5 g  Avoid: Ham (3 oz/85 g) / 6 g  Choose: Ham, lean cut (3 oz/85 g) / 2.4 g  Avoid: Chicken, with skin, dark meat (3 oz/85 g) / 4 g  Choose: Chicken, skin removed, dark meat (3 oz/85 g) / 2 g  Avoid: Chicken, with skin, light meat (3 oz/85 g) / 2.5 g  Choose: Chicken, skin removed, light meat (3 oz/85 g) / 1 g Dairy / Saturated Fat (g)  Avoid: Whole milk (1 cup) / 5 g  Choose: Low-fat milk, 2% (1 cup) / 3 g  Choose: Low-fat milk, 1% (1 cup) / 1.5 g  Choose: Skim milk (1 cup) / 0.3 g  Avoid: Hard cheese (1 oz/28 g) / 6 g  Choose: Skim milk cheese (1 oz/28 g) / 2 to 3 g  Avoid: Cottage cheese, 4% fat (1 cup) / 6.5 g  Choose: Low-fat cottage cheese, 1% fat (1 cup) / 1.5 g  Avoid: Ice cream (1 cup) / 9 g  Choose: Sherbet (1 cup) / 2.5 g  Choose: Nonfat frozen yogurt (1 cup) / 0.3 g  Choose: Frozen fruit bar / trace  Avoid: Whipped cream (1 tbs) / 3.5 g  Choose: Nondairy whipped topping (1 tbs) / 1 g Condiments / Saturated Fat (g)  Avoid: Mayonnaise (1 tbs) / 2 g  Choose: Low-fat mayonnaise (1 tbs) / 1 g  Avoid: Butter (1 tbs) / 7 g  Choose: Extra light margarine (1 tbs) / 1 g  Avoid: Coconut oil (1 tbs) / 11.8 g  Choose: Olive oil (1 tbs) / 1.8 g  Choose: Corn oil (1 tbs) / 1.7 g  Choose: Safflower oil (1 tbs) / 1.2 g  Choose: Sunflower oil (1 tbs) / 1.4 g  Choose: Soybean oil (1 tbs) / 2.4 g  Choose: Canola oil (1 tbs) / 1 g Document Released: 07/06/2005 Document Revised: 10/31/2012 Document Reviewed: 10/04/2013 [employer] Patient Information 2015 [place], [place]. This information is not intended to replace advice given to you by your health care provider. Make sure you discuss any questions you have with your health care provider. Health  Maintenance A healthy lifestyle and preventative care can promote health and wellness.  Maintain regular health, dental, and eye exams.  Eat a healthy diet. Foods like vegetables, fruits, whole grains, low-fat dairy products, and lean protein foods contain the nutrients you need and are low in calories. Decrease your intake of foods high in solid fats, added sugars, and salt. Get information about a proper diet from your health care provider, if necessary.  Regular physical exercise is one of the most important things you can do for your health. Most adults should get at least 150 minutes of moderate-intensity exercise (any activity that increases your heart rate and causes you to sweat) each week. In addition, most adults need muscle-strengthening exercises on 2 or more days a week.   Maintain a healthy weight. The body mass index (BMI) is a screening tool to  identify possible weight problems. It provides an estimate of body fat based on height and weight. Your health care provider can find your BMI and can help you achieve or maintain a healthy weight. For males 20 years and older:  A BMI below 18.5 is considered underweight.  A BMI of 18.5 to 24.9 is normal.  A BMI of 25 to 29.9 is considered overweight.  A BMI of 30 and above is considered obese.  Maintain normal blood lipids and cholesterol by exercising and minimizing your intake of saturated fat. Eat a balanced diet with plenty of fruits and vegetables. Blood tests for lipids and cholesterol should begin at age 42 and be repeated every 5 years. If your lipid or cholesterol levels are high, you are over age 12, or you are at high risk for heart disease, you may need your cholesterol levels checked more frequently.Ongoing high lipid and cholesterol levels should be treated with medicines if diet and exercise are not working.  If you smoke, find out from your health care provider how to quit. If you do not use tobacco, do not  start.  Lung cancer screening is recommended for adults aged 74-80 years who are at high risk for developing lung cancer because of a history of smoking. A yearly low-dose CT scan of the lungs is recommended for people who have at least a 30-pack-year history of smoking and are current smokers or have quit within the past 15 years. A pack year of smoking is smoking an average of 1 pack of cigarettes a day for 1 year (for example, a 30-pack-year history of smoking could mean smoking 1 pack a day for 30 years or 2 packs a day for 15 years). Yearly screening should continue until the smoker has stopped smoking for at least 15 years. Yearly screening should be stopped for people who develop a health problem that would prevent them from having lung cancer treatment.  If you choose to drink alcohol, do not have more than 2 drinks per day. One drink is considered to be 12 oz (360 mL) of beer, 5 oz (150 mL) of wine, or 1.5 oz (45 mL) of liquor.  Avoid the use of street drugs. Do not share needles with anyone. Ask for help if you need support or instructions about stopping the use of drugs.  High blood pressure causes heart disease and increases the risk of stroke. Blood pressure should be checked at least every 1-2 years. Ongoing high blood pressure should be treated with medicines if weight loss and exercise are not effective.  If you are 80-4 years old, ask your health care provider if you should take aspirin to prevent heart disease.  Diabetes screening involves taking a blood sample to check your fasting blood sugar level. This should be done once every 3 years after age 45 if you are at a normal weight and without risk factors for diabetes. Testing should be considered at a younger age or be carried out more frequently if you are overweight and have at least 1 risk factor for diabetes.  Colorectal cancer can be detected and often prevented. Most routine colorectal cancer screening begins at the age of 63  and continues through age 40. However, your health care provider may recommend screening at an earlier age if you have risk factors for colon cancer. On a yearly basis, your health care provider may provide home test kits to check for hidden blood in the stool. A small camera at the end  of a tube may be used to directly examine the colon (sigmoidoscopy or colonoscopy) to detect the earliest forms of colorectal cancer. Talk to your health care provider about this at age 63 when routine screening begins. A direct exam of the colon should be repeated every 5-10 years through age 94, unless early forms of precancerous polyps or small growths are found.  People who are at an increased risk for hepatitis B should be screened for this virus. You are considered at high risk for hepatitis B if:  You were born in a country where hepatitis B occurs often. Talk with your health care provider about which countries are considered high risk.  Your parents were born in a high-risk country and you have not received a shot to protect against hepatitis B (hepatitis B vaccine).  You have HIV or AIDS.  You use needles to inject street drugs.  You live with, or have sex with, someone who has hepatitis B.  You are a man who has sex with other men (MSM).  You get hemodialysis treatment.  You take certain medicines for conditions like cancer, organ transplantation, and autoimmune conditions.  Hepatitis C blood testing is recommended for all people born from [birth year] through 1965 and any individual with known risk factors for hepatitis C.  Healthy men should no longer receive prostate-specific antigen (PSA) blood tests as part of routine cancer screening. Talk to your health care provider about prostate cancer screening.  Testicular cancer screening is not recommended for adolescents or adult males who have no symptoms. Screening includes self-exam, a health care provider exam, and other screening tests. Consult with your  health care provider about any symptoms you have or any concerns you have about testicular cancer.  Practice safe sex. Use condoms and avoid high-risk sexual practices to reduce the spread of sexually transmitted infections (STIs).  You should be screened for STIs, including gonorrhea and chlamydia if:  You are sexually active and are younger than 24 years.  You are older than 24 years, and your health care provider tells you that you are at risk for this type of infection.  Your sexual activity has changed since you were last screened, and you are at an increased risk for chlamydia or gonorrhea. Ask your health care provider if you are at risk.  If you are at risk of being infected with HIV, it is recommended that you take a prescription medicine daily to prevent HIV infection. This is called pre-exposure prophylaxis (PrEP). You are considered at risk if:  You are a man who has sex with other men (MSM).  You are a heterosexual man who is sexually active with multiple partners.  You take drugs by injection.  You are sexually active with a partner who has HIV.  Talk with your health care provider about whether you are at high risk of being infected with HIV. If you choose to begin PrEP, you should first be tested for HIV. You should then be tested every 3 months for as long as you are taking PrEP.  Use sunscreen. Apply sunscreen liberally and repeatedly throughout the day. You should seek shade when your shadow is shorter than you. Protect yourself by wearing long sleeves, pants, a wide-brimmed hat, and sunglasses year round whenever you are outdoors.  Tell your health care provider of new moles or changes in moles, especially if there is a change in shape or color. Also, tell your health care provider if a mole is larger than the  size of a pencil eraser.  A one-time screening for abdominal aortic aneurysm (AAA) and surgical repair of large AAAs by ultrasound is recommended for men aged  4-75 years who are current or former smokers.  Stay current with your vaccines (immunizations). Document Released: 01/02/2008 Document Revised: 07/11/2013 Document Reviewed: 12/01/2010 [facility] Patient Information 2015 [place], [place]. This information is not intended to replace advice given to you by your health care provider. Make sure you discuss any questions you have with your health care provider.

## 2015-01-11 NOTE — Telephone Encounter (Signed)
Spoke to pt, told him Rx's sent to [facility]. Pt verbalized understanding.

## 2015-01-11 NOTE — Telephone Encounter (Signed)
Pt needs [facility] rxs [facility] albuterol inhaler, pulmicort and cialis 20 mg #15 for 90 day supply w/refills

## 2015-01-11 NOTE — Progress Notes (Signed)
Subjective:    Patient ID: Roy Griffin, male    DOB: 1971/05/20, 44 y.o.   MRN: [id number]  HPI  History of Present Illness: 38 patient who is seen today for an annual exam. Medical problems include a history of asthma onset approximately age 26. He has a history of dyslipidemia and history of prior tobacco use. He has a family history of premature heart disease. His asthma has been well controlled.  This spring he has significant symptoms of SAR Preventive Screening-Counseling & Management    Caffeine-Diet-Exercise  Does Patient Exercise: yes  Allergies (verified): 1) ! Penicillin G Procaine (Penicillin G Procaine)  Past History:  Past Medical History: Allergic rhinitis Asthma Hyperlipidemia tobacco abuse, h/o  Past Surgical History: cosmetic facial surgery in 1990 ACL reconstructive surgery 2000 Inguinal hernia repair 2008  Family History:  father died age 40. Acute MI mother, age 63, history of hypertension no siblings  Social History: Reviewed history and no changes required. 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  HENT: Negative for congestion, dental problem, ear pain, hearing loss, mouth sores, rhinorrhea, sinus pressure, sneezing, tinnitus, trouble swallowing and voice change.   Eyes: Negative for photophobia, pain, redness and visual disturbance.  Respiratory: Negative for apnea, cough, choking, chest tightness, shortness of breath and wheezing.   Cardiovascular: Negative for chest pain, palpitations and leg swelling.  Gastrointestinal: Negative for nausea, vomiting, abdominal pain, diarrhea, constipation, blood in stool, abdominal distention, anal bleeding and rectal pain.  Genitourinary: Negative for dysuria, urgency, frequency,  hematuria, flank pain, decreased urine volume, discharge, penile swelling, scrotal swelling, difficulty urinating, genital sores and testicular pain.  Musculoskeletal: Negative for myalgias, back pain, joint swelling, arthralgias, gait problem, neck pain and neck stiffness.  Skin: Negative for color change, rash and wound.  Neurological: Negative for dizziness, tremors, seizures, syncope, facial asymmetry, speech difficulty, weakness, light-headedness, numbness and headaches.  Hematological: Negative for adenopathy. Does not bruise/bleed easily.  Psychiatric/Behavioral: Negative for suicidal ideas, hallucinations, behavioral problems, confusion, sleep disturbance, self-injury, dysphoric mood, decreased concentration and agitation. The patient is not nervous/anxious.        Objective:   Physical Exam  Constitutional: He appears well-developed and well-nourished.  HENT:  Head: Normocephalic and atraumatic.  Right Ear: External ear normal.  Left Ear: External ear normal.  Nose: Nose normal.  Mouth/Throat: Oropharynx is clear and moist.  Eyes: Conjunctivae and EOM are normal. Pupils are equal, round, and reactive to light. No scleral icterus.  Neck: Normal range of motion. Neck supple. No JVD present. No thyromegaly present.  Cardiovascular: Regular rhythm, normal heart sounds and intact distal pulses.  Exam reveals no gallop and no friction rub.   No murmur heard. Pulmonary/Chest: Effort normal and breath sounds normal. He exhibits no tenderness.  Abdominal: Soft. Bowel sounds are normal. He exhibits no distension and no mass. There is no tenderness.  Genitourinary: Penis normal.  Musculoskeletal: Normal range of motion. He exhibits no edema or tenderness.  Lymphadenopathy:    He has no cervical adenopathy.  Neurological: He is alert. He has normal reflexes. No cranial nerve deficit. Coordination normal.  Skin: Skin is warm and dry. No rash noted.  Psychiatric: He has a normal mood and  affect. His behavior is normal.          Assessment & Plan:   Preventive health examination  Asthma, stable Seasonal allergic rhinitis Dyslipidemia.  Patient is adhering to a more rigorous exercise regimen and diet.  He has been losing about 3-4 pounds per month  Check lipid profile.  6 months Some OSA concerns, we'll consider home sleep study

## 2015-01-11 NOTE — Progress Notes (Signed)
Pre visit review using our clinic review tool, if applicable. No additional management support is needed unless otherwise documented below in the visit note. 

## 2016-01-07 ENCOUNTER — Other Ambulatory Visit (INDEPENDENT_AMBULATORY_CARE_PROVIDER_SITE_OTHER): Payer: 59

## 2016-01-07 DIAGNOSIS — Z0000 Encounter for general adult medical examination without abnormal findings: Secondary | ICD-10-CM | POA: Diagnosis not present

## 2016-01-07 LAB — BASIC METABOLIC PANEL
BUN: 15 mg/dL (ref 6–23)
CHLORIDE: 102 meq/L (ref 96–112)
CO2: 26 mEq/L (ref 19–32)
Calcium: 9.4 mg/dL (ref 8.4–10.5)
Creatinine, Ser: 0.98 mg/dL (ref 0.40–1.50)
GFR: 87.8 mL/min (ref >60.00)
Glucose, Bld: 94 mg/dL (ref 70–99)
Potassium: 3.8 mEq/L (ref 3.5–5.1)
Sodium: 136 mEq/L (ref 135–145)

## 2016-01-07 LAB — LIPID PANEL
CHOLESTEROL: 230 mg/dL — AB (ref 0–200)
HDL: 41.4 mg/dL (ref >39.00)
LDL Cholesterol: 170 mg/dL — ABNORMAL HIGH (ref 0–99)
NonHDL: 188.66
TRIGLYCERIDES: 93 mg/dL (ref 0.0–149.0)
Total CHOL/HDL Ratio: 6
VLDL: 18.6 mg/dL (ref 0.0–40.0)

## 2016-01-07 LAB — HEPATIC FUNCTION PANEL
ALT: 44 U/L (ref 0–53)
AST: 37 U/L (ref 0–37)
Albumin: 4.1 g/dL (ref 3.5–5.2)
Alkaline Phosphatase: 73 U/L (ref 39–117)
BILIRUBIN DIRECT: 0.1 mg/dL (ref 0.0–0.3)
BILIRUBIN TOTAL: 0.4 mg/dL (ref 0.2–1.2)
TOTAL PROTEIN: 7.2 g/dL (ref 6.0–8.3)

## 2016-01-07 LAB — POC URINALSYSI DIPSTICK (AUTOMATED)
BILIRUBIN UA: NEGATIVE
Blood, UA: NEGATIVE
Glucose, UA: NEGATIVE
KETONES UA: NEGATIVE
LEUKOCYTES UA: NEGATIVE
Nitrite, UA: NEGATIVE
Protein, UA: NEGATIVE
Spec Grav, UA: <=1.005
Urobilinogen, UA: 0.2
pH, UA: 5.5

## 2016-01-07 LAB — CBC WITH DIFFERENTIAL/PLATELET
BASOS ABS: 0.1 10*3/uL (ref 0.0–0.1)
Basophils Relative: 0.8 % (ref 0.0–3.0)
EOS ABS: 0.3 10*3/uL (ref 0.0–0.7)
Eosinophils Relative: 5 % (ref 0.0–5.0)
HCT: 46 % (ref 39.0–52.0)
Hemoglobin: 15.8 g/dL (ref 13.0–17.0)
LYMPHS ABS: 2.2 10*3/uL (ref 0.7–4.0)
Lymphocytes Relative: 33.6 % (ref 12.0–46.0)
MCHC: 34.3 g/dL (ref 30.0–36.0)
MCV: 86.6 fl (ref 78.0–100.0)
Monocytes Absolute: 0.5 10*3/uL (ref 0.1–1.0)
Monocytes Relative: 7.3 % (ref 3.0–12.0)
NEUTROS ABS: 3.5 10*3/uL (ref 1.4–7.7)
NEUTROS PCT: 53.3 % (ref 43.0–77.0)
Platelets: 233 10*3/uL (ref 150.0–400.0)
RBC: 5.31 Mil/uL (ref 4.22–5.81)
RDW: 12.6 % (ref 11.5–15.5)
WBC: 6.7 10*3/uL (ref 4.0–10.5)

## 2016-01-07 LAB — TSH: TSH: 2.9 u[IU]/mL (ref 0.35–4.50)

## 2016-01-14 ENCOUNTER — Ambulatory Visit (INDEPENDENT_AMBULATORY_CARE_PROVIDER_SITE_OTHER): Payer: 59 | Admitting: Internal Medicine

## 2016-01-14 ENCOUNTER — Encounter: Payer: Self-pay | Admitting: Internal Medicine

## 2016-01-14 VITALS — BP 132/90 | HR 67 | Temp 97.7°F | Resp 20 | Ht 67.5 in | Wt 188.0 lb

## 2016-01-14 DIAGNOSIS — Z3009 Encounter for other general counseling and advice on contraception: Secondary | ICD-10-CM

## 2016-01-14 DIAGNOSIS — J4520 Mild intermittent asthma, uncomplicated: Secondary | ICD-10-CM

## 2016-01-14 DIAGNOSIS — Z309 Encounter for contraceptive management, unspecified: Secondary | ICD-10-CM

## 2016-01-14 DIAGNOSIS — E785 Hyperlipidemia, unspecified: Secondary | ICD-10-CM

## 2016-01-14 DIAGNOSIS — Z0000 Encounter for general adult medical examination without abnormal findings: Secondary | ICD-10-CM

## 2016-01-14 MED ORDER — BETAMETHASONE DIPROPIONATE 0.05 % EX CREA: TOPICAL_CREAM | Freq: Two times a day (BID) | CUTANEOUS | Status: DC

## 2016-01-14 NOTE — Patient Instructions (Signed)
It is important that you exercise regularly, at least 20 minutes 3 to 4 times per week.  If you develop chest pain or shortness of breath seek  medical attention.  You need to lose weight.  Consider a lower calorie diet and regular exercise.  Return in one year for follow-up   

## 2016-01-14 NOTE — Progress Notes (Signed)
Pre visit review using our clinic review tool, if applicable. No additional management support is needed unless otherwise documented below in the visit note. 

## 2016-01-14 NOTE — Progress Notes (Signed)
Subjective:    Patient ID: Roy Griffin, male    DOB: [birth year], 45 y.o.   MRN: [id number]  HPI    Subjective:    Patient ID: Roy Griffin, male    DOB: 1970/07/27, 45 y.o.   MRN: [id number]  HPI  History of Present Illness: 32  patient who is seen today for an annual exam. Medical problems include a history of asthma onset approximately age 35. He has a history of dyslipidemia and history of prior tobacco use. He has a family history of premature heart disease. His asthma has been well controlled.   Preventive Screening-Counseling & Management  Wt Readings from Last 3 Encounters:  01/14/16 188 lb (85.276 kg)  01/11/15 191 lb (86.637 kg)  11/13/13 196 lb (88.905 kg)    Caffeine-Diet-Exercise  Does Patient Exercise: yes  Allergies (verified): 1) ! Penicillin G Procaine (Penicillin G Procaine)  Past History:  Past Medical History: Allergic rhinitis Asthma Hyperlipidemia tobacco abuse, h/o  Past Surgical History: cosmetic facial surgery in 1990 ACL reconstructive surgery 2000 Inguinal hernia repair 2008  Family History:  father died age 43. 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  HENT: Negative for congestion, dental problem, ear pain, hearing loss, mouth sores, rhinorrhea, sinus pressure, sneezing, tinnitus, trouble swallowing and voice change.   Eyes: Negative for photophobia, pain, redness and visual disturbance.  Respiratory: Negative for apnea, cough, choking, chest tightness, shortness of breath and wheezing.   Cardiovascular: Negative for chest pain, palpitations and leg swelling.  Gastrointestinal: Negative for nausea, vomiting, abdominal pain, diarrhea,  constipation, blood in stool, abdominal distention, anal bleeding and rectal pain.  Genitourinary: Negative for dysuria, urgency, frequency, hematuria, flank pain, decreased urine volume, discharge, penile swelling, scrotal swelling, difficulty urinating, genital sores and testicular pain.  Musculoskeletal: Negative for myalgias, back pain, joint swelling, arthralgias, gait problem, neck pain and neck stiffness.  Skin: Negative for color change, rash and wound.  Neurological: Negative for dizziness, tremors, seizures, syncope, facial asymmetry, speech difficulty, weakness, light-headedness, numbness and headaches.  Hematological: Negative for adenopathy. Does not bruise/bleed easily.  Psychiatric/Behavioral: Negative for suicidal ideas, hallucinations, behavioral problems, confusion, sleep disturbance, self-injury, dysphoric mood, decreased concentration and agitation. The patient is not nervous/anxious.        Objective:   Physical Exam  Constitutional: He appears well-developed and well-nourished.  HENT:  Head: Normocephalic and atraumatic.  Right Ear: External ear normal.  Left Ear: External ear normal.  Nose: Nose normal.  Mouth/Throat: Oropharynx is clear and moist.  Eyes: Conjunctivae and EOM are normal. Pupils are equal, round, and reactive to light. No scleral icterus.  Neck: Normal range of motion. Neck supple. No JVD present. No thyromegaly present.  Cardiovascular: Regular rhythm, normal heart sounds and intact distal pulses.  Exam reveals no gallop and no friction rub.   No murmur heard. Pulmonary/Chest: Effort normal and breath sounds normal. He exhibits no tenderness.  Abdominal: Soft. Bowel sounds are normal. He exhibits no distension and no mass. There is no tenderness.  Genitourinary: Penis normal.  Musculoskeletal: Normal range of motion. He exhibits no edema or tenderness.  Lymphadenopathy:    He has no cervical adenopathy.  Neurological: He is alert. He has normal  reflexes. No cranial nerve deficit. Coordination normal.  Skin:  Skin is warm and dry. No rash noted.  Psychiatric: He has a normal mood and affect. His behavior is normal.          Assessment & Plan:   Preventive health examination Asthma, stable Seasonal allergic rhinitis Dyslipidemia.    Lipid profile reviewed.  Will continue nonpharmacologic efforts.  Present criteria for statin therapy discussed  Review of Systems     Objective:   Physical Exam  Skin:  Dry, thickened, scaly rash most prominent over the left knee area          Assessment & Plan:  As above Dermatitis, left knee.  Probable eczema.  This has not responded to triamcinolone cream.  We'll try a higher potency topical steroid.  If unimproved, will set up for dermatologic evaluation

## 2016-06-18 ENCOUNTER — Other Ambulatory Visit: Payer: Self-pay | Admitting: Internal Medicine

## 2016-11-12 ENCOUNTER — Other Ambulatory Visit: Payer: Self-pay | Admitting: Internal Medicine

## 2017-01-04 ENCOUNTER — Other Ambulatory Visit: Payer: Self-pay | Admitting: Internal Medicine

## 2017-01-04 DIAGNOSIS — E785 Hyperlipidemia, unspecified: Secondary | ICD-10-CM

## 2017-01-04 DIAGNOSIS — Z0000 Encounter for general adult medical examination without abnormal findings: Secondary | ICD-10-CM

## 2017-01-06 ENCOUNTER — Other Ambulatory Visit: Payer: 59

## 2017-01-13 ENCOUNTER — Encounter: Payer: 59 | Admitting: Internal Medicine

## 2017-03-05 ENCOUNTER — Ambulatory Visit (INDEPENDENT_AMBULATORY_CARE_PROVIDER_SITE_OTHER): Payer: 59 | Admitting: Internal Medicine

## 2017-03-05 ENCOUNTER — Encounter: Payer: Self-pay | Admitting: Internal Medicine

## 2017-03-05 VITALS — BP 108/64 | HR 89 | Temp 98.4°F | Ht 67.5 in | Wt 194.2 lb

## 2017-03-05 DIAGNOSIS — Z0000 Encounter for general adult medical examination without abnormal findings: Secondary | ICD-10-CM

## 2017-03-05 LAB — COMPREHENSIVE METABOLIC PANEL
ALK PHOS: 74 U/L (ref 39–117)
ALT: 50 U/L (ref 0–53)
AST: 30 U/L (ref 0–37)
Albumin: 3.8 g/dL (ref 3.5–5.2)
BILIRUBIN TOTAL: 0.5 mg/dL (ref 0.2–1.2)
BUN: 12 mg/dL (ref 6–23)
CO2: 28 mEq/L (ref 19–32)
CREATININE: 0.92 mg/dL (ref 0.40–1.50)
Calcium: 9 mg/dL (ref 8.4–10.5)
Chloride: 104 mEq/L (ref 96–112)
GFR: 93.96 mL/min (ref >60.00)
GLUCOSE: 96 mg/dL (ref 70–99)
Potassium: 4.5 mEq/L (ref 3.5–5.1)
SODIUM: 138 meq/L (ref 135–145)
TOTAL PROTEIN: 6.4 g/dL (ref 6.0–8.3)

## 2017-03-05 LAB — TSH: TSH: 3.11 u[IU]/mL (ref 0.35–4.50)

## 2017-03-05 LAB — CBC WITH DIFFERENTIAL/PLATELET
Basophils Absolute: 0.1 10*3/uL (ref 0.0–0.1)
Basophils Relative: 1.1 % (ref 0.0–3.0)
EOS PCT: 8.7 % — AB (ref 0.0–5.0)
Eosinophils Absolute: 0.5 10*3/uL (ref 0.0–0.7)
HCT: 44.9 % (ref 39.0–52.0)
HEMOGLOBIN: 15.3 g/dL (ref 13.0–17.0)
Lymphocytes Relative: 38.3 % (ref 12.0–46.0)
Lymphs Abs: 2 10*3/uL (ref 0.7–4.0)
MCHC: 34.2 g/dL (ref 30.0–36.0)
MCV: 87.6 fl (ref 78.0–100.0)
MONO ABS: 0.5 10*3/uL (ref 0.1–1.0)
MONOS PCT: 10.1 % (ref 3.0–12.0)
Neutro Abs: 2.2 10*3/uL (ref 1.4–7.7)
Neutrophils Relative %: 41.8 % — ABNORMAL LOW (ref 43.0–77.0)
Platelets: 251 10*3/uL (ref 150.0–400.0)
RBC: 5.13 Mil/uL (ref 4.22–5.81)
RDW: 12.4 % (ref 11.5–15.5)
WBC: 5.2 10*3/uL (ref 4.0–10.5)

## 2017-03-05 LAB — LIPID PANEL
Cholesterol: 223 mg/dL — ABNORMAL HIGH (ref 0–200)
HDL: 34.4 mg/dL — ABNORMAL LOW (ref >39.00)
LDL Cholesterol: 155 mg/dL — ABNORMAL HIGH (ref 0–99)
NONHDL: 188.94
Total CHOL/HDL Ratio: 6
Triglycerides: 171 mg/dL — ABNORMAL HIGH (ref 0.0–149.0)
VLDL: 34.2 mg/dL (ref 0.0–40.0)

## 2017-03-05 NOTE — Patient Instructions (Addendum)
It is important that you exercise regularly, at least 20 minutes 3 to 4 times per week.  If you develop chest pain or shortness of breath seek  medical attention.  You need to lose weight.  Consider a lower calorie diet and regular exercise.   DASH Eating Plan DASH stands for "Dietary Approaches to Stop Hypertension." The DASH eating plan is a healthy eating plan that has been shown to reduce high blood pressure (hypertension). It may also reduce your risk for type 2 diabetes, heart disease, and stroke. The DASH eating plan may also help with weight loss. What are tips for following this plan? General guidelines  Avoid eating more than 2,300 mg (milligrams) of salt (sodium) a day. If you have hypertension, you may need to reduce your sodium intake to 1,500 mg a day.  Limit alcohol intake to no more than 1 drink a day for nonpregnant women and 2 drinks a day for men. One drink equals 12 oz of beer, 5 oz of wine, or 1 oz of hard liquor.  Work with your health care provider to maintain a healthy body weight or to lose weight. Ask what an ideal weight is for you.  Get at least 30 minutes of exercise that causes your heart to beat faster (aerobic exercise) most days of the week. Activities may include walking, swimming, or biking.  Work with your health care provider or diet and nutrition specialist (dietitian) to adjust your eating plan to your individual calorie needs. Reading food labels  Check food labels for the amount of sodium per serving. Choose foods with less than 5 percent of the Daily Value of sodium. Generally, foods with less than 300 mg of sodium per serving fit into this eating plan.  To find whole grains, look for the word "whole" as the first word in the ingredient list. Shopping  Buy products labeled as "low-sodium" or "no salt added."  Buy fresh foods. Avoid canned foods and premade or frozen meals. Cooking  Avoid adding salt when cooking. Use salt-free seasonings or  herbs instead of table salt or sea salt. Check with your health care provider or pharmacist before using salt substitutes.  Do not fry foods. Cook foods using healthy methods such as baking, boiling, grilling, and broiling instead.  Cook with heart-healthy oils, such as olive, canola, soybean, or sunflower oil. Meal planning   Eat a balanced diet that includes: ? 5 or more servings of fruits and vegetables each day. At each meal, try to fill half of your plate with fruits and vegetables. ? Up to 6-8 servings of whole grains each day. ? Less than 6 oz of lean meat, poultry, or fish each day. A 3-oz serving of meat is about the same size as a deck of cards. One egg equals 1 oz. ? 2 servings of low-fat dairy each day. ? A serving of nuts, seeds, or beans 5 times each week. ? Heart-healthy fats. Healthy fats called Omega-3 fatty acids are found in foods such as flaxseeds and coldwater fish, like sardines, salmon, and mackerel.  Limit how much you eat of the following: ? Canned or prepackaged foods. ? Food that is high in trans fat, such as fried foods. ? Food that is high in saturated fat, such as fatty meat. ? Sweets, desserts, sugary drinks, and other foods with added sugar. ? Full-fat dairy products.  Do not salt foods before eating.  Try to eat at least 2 vegetarian meals each week.  Eat more  home-cooked food and less restaurant, buffet, and fast food.  When eating at a restaurant, ask that your food be prepared with less salt or no salt, if possible. What foods are recommended? The items listed may not be a complete list. Talk with your dietitian about what dietary choices are best for you. Grains Whole-grain or whole-wheat bread. Whole-grain or whole-wheat pasta. Brown rice. [place]. Bulgur. Whole-grain and low-sodium cereals. Pita bread. Low-fat, low-sodium crackers. Whole-wheat flour tortillas. Vegetables Fresh or frozen vegetables (raw, steamed, roasted, or grilled).  Low-sodium or reduced-sodium tomato and vegetable juice. Low-sodium or reduced-sodium tomato sauce and tomato paste. Low-sodium or reduced-sodium canned vegetables. Fruits All fresh, dried, or frozen fruit. Canned fruit in natural juice (without added sugar). Meat and other protein foods Skinless chicken or [place]. Ground chicken or [place]. Pork with fat trimmed off. Fish and seafood. Egg whites. Dried beans, peas, or lentils. Unsalted nuts, nut butters, and seeds. Unsalted canned beans. Lean cuts of beef with fat trimmed off. Low-sodium, lean deli meat. Dairy Low-fat (1%) or fat-free (skim) milk. Fat-free, low-fat, or reduced-fat cheeses. Nonfat, low-sodium ricotta or cottage cheese. Low-fat or nonfat yogurt. Low-fat, low-sodium cheese. Fats and oils Soft margarine without trans fats. Vegetable oil. Low-fat, reduced-fat, or light mayonnaise and salad dressings (reduced-sodium). Canola, safflower, olive, soybean, and sunflower oils. Avocado. Seasoning and other foods Herbs. Spices. Seasoning mixes without salt. Unsalted popcorn and pretzels. Fat-free sweets. What foods are not recommended? The items listed may not be a complete list. Talk with your dietitian about what dietary choices are best for you. Grains Baked goods made with fat, such as croissants, muffins, or some breads. Dry pasta or rice meal packs. Vegetables Creamed or fried vegetables. Vegetables in a cheese sauce. Regular canned vegetables (not low-sodium or reduced-sodium). Regular canned tomato sauce and paste (not low-sodium or reduced-sodium). Regular tomato and vegetable juice (not low-sodium or reduced-sodium). Angie Fava. Olives. Fruits Canned fruit in a light or heavy syrup. Fried fruit. Fruit in cream or butter sauce. Meat and other protein foods Fatty cuts of meat. Ribs. Fried meat. Berniece Salines. Sausage. Bologna and other processed lunch meats. Salami. Fatback. Hotdogs. Bratwurst. Salted nuts and seeds. Canned beans with added  salt. Canned or smoked fish. Whole eggs or egg yolks. Chicken or [place] with skin. Dairy Whole or 2% milk, cream, and half-and-half. Whole or full-fat cream cheese. Whole-fat or sweetened yogurt. Full-fat cheese. Nondairy creamers. Whipped toppings. Processed cheese and cheese spreads. Fats and oils Butter. Stick margarine. Lard. Shortening. Ghee. Bacon fat. Tropical oils, such as coconut, palm kernel, or palm oil. Seasoning and other foods Salted popcorn and pretzels. Onion salt, garlic salt, seasoned salt, table salt, and sea salt. Worcestershire sauce. Tartar sauce. Barbecue sauce. Teriyaki sauce. Soy sauce, including reduced-sodium. Steak sauce. Canned and packaged gravies. Fish sauce. Oyster sauce. Cocktail sauce. Horseradish that you find on the shelf. Ketchup. Mustard. Meat flavorings and tenderizers. Bouillon cubes. Hot sauce and Tabasco sauce. Premade or packaged marinades. Premade or packaged taco seasonings. Relishes. Regular salad dressings. Where to find more information:  National Heart, Lung, and [place]: https://wilson-eaton.com/  American Heart Association: www.heart.org Summary  The DASH eating plan is a healthy eating plan that has been shown to reduce high blood pressure (hypertension). It may also reduce your risk for type 2 diabetes, heart disease, and stroke.  With the DASH eating plan, you should limit salt (sodium) intake to 2,300 mg a day. If you have hypertension, you may need to reduce your sodium intake to 1,500  mg a day.  When on the DASH eating plan, aim to eat more fresh fruits and vegetables, whole grains, lean proteins, low-fat dairy, and heart-healthy fats.  Work with your health care provider or diet and nutrition specialist (dietitian) to adjust your eating plan to your individual calorie needs. This information is not intended to replace advice given to you by your health care provider. Make sure you discuss any questions you have with your health care  provider. Document Released: 06/25/2011 Document Revised: 06/29/2016 Document Reviewed: 06/29/2016 Elsevier Interactive Patient Education  2017 [employer].

## 2017-03-05 NOTE — Progress Notes (Signed)
Subjective:    Patient ID: Roy Griffin, male    DOB: [birth year], 46 y.o.   MRN: [id number]  HPI  46 year old patient who is seen today for a preventive health examination He enjoys excellent health.  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  Twin daughters age 11.  Divorced  Surgical history status post vasectomy December 2017  Past Medical History:  Diagnosis Date  . Allergic rhinitis   . Asthma   . Hyperlipidemia   . Tobacco abuse      Social History   Social History  . Marital status: Married    Spouse name: N/A  . Number of children: N/A  . Years of education: N/A   Occupational History  . Not on file.   Social History Main Topics  . Smoking status: Former Smoker    Packs/day: 0.50    Types: Cigarettes    Quit date: 10/18/2012  . Smokeless tobacco: Never Used  . Alcohol use 0.0 oz/week  . Drug use: No  . Sexual activity: Not on file   Other Topics Concern  . Not on file   Social History Narrative  . No narrative on file    Past Surgical History:  Procedure Laterality Date  . ACL reconstructive surgery  2000  . angle [place] repair  2008  . FACIAL COSMETIC SURGERY  1990  . Peyronie's disease      Family History  Problem Relation Age of Onset  . Hypertension Mother   . Heart attack Father   . Diabetes Paternal Aunt     Allergies  Allergen Reactions  . Penicillins     Current Outpatient Prescriptions on File Prior to Visit  Medication Sig Dispense Refill  . betamethasone dipropionate (DIPROLENE) 0.05 % cream APPLY TO AFFECTED AREA(S)  TOPICALLY TWO TIMES DAILY 30 g 2  . cetirizine (ZYRTEC) 10 MG tablet Take 10 mg by mouth as needed for allergies.    . CIALIS 20 MG tablet  TAKE 1/2 TO 1 TABLET BY  MOUTH EVERY OTHER DAY AS  NEEDED FOR ERECTILE  DYSFUNCTION 15 tablet 1  . fluticasone (FLONASE ALLERGY RELIEF) 50 MCG/ACT nasal spray Place 1 spray into both nostrils as needed.     Marland Kitchen PULMICORT FLEXHALER 180 MCG/ACT inhaler INHALE 2 PUFFS BY MOUTH TWO TIMES DAILY 3 each 2  . VENTOLIN HFA 108 (90 Base) MCG/ACT inhaler INHALE 2 PUFFS BY MOUTH  EVERY 6 HOURS AS NEEDED FOR WHEEZING 72 g 2   No current facility-administered medications on file prior to visit.     BP 108/64 (BP Location: Left Arm, Patient Position: Sitting, Cuff Size: Normal)   Pulse 89   Temp 98.4 F (36.9 C) (Oral)   Ht 5' 7.5" (1.715 m)   Wt 194 lb 3.2 oz (88.1 kg)   SpO2 98%   BMI 29.97 kg/m     Review of Systems  Constitutional: Negative for appetite change, chills, fatigue and fever.  HENT: Negative for congestion, dental problem, ear pain, hearing loss, sore throat, tinnitus, trouble swallowing and voice change.   Eyes: Negative for pain, discharge and visual disturbance.  Respiratory: Positive for wheezing. Negative for cough, chest tightness and stridor.   Cardiovascular: Negative for chest pain,  palpitations and leg swelling.  Gastrointestinal: Negative for abdominal distention, abdominal pain, blood in stool, constipation, diarrhea, nausea and vomiting.  Genitourinary: Negative for difficulty urinating, discharge, flank pain, genital sores, hematuria and urgency.  Musculoskeletal: Negative for arthralgias, back pain, gait problem, joint swelling, myalgias and neck stiffness.  Skin: Negative for rash.  Neurological: Negative for dizziness, syncope, speech difficulty, weakness, numbness and headaches.  Hematological: Negative for adenopathy. Does not bruise/bleed easily.  Psychiatric/Behavioral: Negative for behavioral problems and dysphoric mood. The patient is not nervous/anxious.        Objective:   Physical Exam  Constitutional: He appears well-developed and well-nourished.    HENT:  Head: Normocephalic and atraumatic.  Right Ear: External ear normal.  Left Ear: External ear normal.  Nose: Nose normal.  Mouth/Throat: Oropharynx is clear and moist.  Eyes: Pupils are equal, round, and reactive to light. Conjunctivae and EOM are normal. No scleral icterus.  Neck: Normal range of motion. Neck supple. No JVD present. No thyromegaly present.  Cardiovascular: Regular rhythm, normal heart sounds and intact distal pulses.  Exam reveals no gallop and no friction rub.   No murmur heard. Pulmonary/Chest: Effort normal and breath sounds normal. He exhibits no tenderness.  Abdominal: Soft. Bowel sounds are normal. He exhibits no distension and no mass. There is no tenderness.  Genitourinary: Prostate normal and penis normal. Rectal exam shows guaiac negative stool.  Musculoskeletal: Normal range of motion. He exhibits no edema or tenderness.  Lymphadenopathy:    He has no cervical adenopathy.  Neurological: He is alert. He has normal reflexes. No cranial nerve deficit. Coordination normal.  Skin: Skin is warm and dry. No rash noted.  Psychiatric: He has a normal mood and affect. His behavior is normal.          Assessment & Plan:   Preventive health Overweight Dyslipidemia History of asthma/allergic rhinitis Family history of coronary artery disease  Modest weight loss encouraged More rigorous exercise regimen discussed Check screening lab  Follow-up one year  Nyoka Cowden

## 2017-04-08 ENCOUNTER — Encounter: Payer: Self-pay | Admitting: Internal Medicine

## 2017-11-07 ENCOUNTER — Other Ambulatory Visit: Payer: Self-pay | Admitting: Internal Medicine

## 2017-11-08 NOTE — Telephone Encounter (Signed)
Okay for refill?  

## 2018-01-04 ENCOUNTER — Other Ambulatory Visit: Payer: Self-pay | Admitting: Internal Medicine

## 2018-03-16 ENCOUNTER — Encounter: Payer: Self-pay | Admitting: Internal Medicine

## 2018-03-16 ENCOUNTER — Ambulatory Visit (INDEPENDENT_AMBULATORY_CARE_PROVIDER_SITE_OTHER): Payer: 59 | Admitting: Internal Medicine

## 2018-03-16 VITALS — BP 118/70 | HR 95 | Temp 98.5°F | Ht 68.0 in | Wt 195.0 lb

## 2018-03-16 DIAGNOSIS — Z0000 Encounter for general adult medical examination without abnormal findings: Secondary | ICD-10-CM

## 2018-03-16 LAB — COMPREHENSIVE METABOLIC PANEL
ALK PHOS: 79 U/L (ref 39–117)
ALT: 32 U/L (ref 0–53)
AST: 24 U/L (ref 0–37)
Albumin: 4.3 g/dL (ref 3.5–5.2)
BUN: 15 mg/dL (ref 6–23)
CO2: 25 mEq/L (ref 19–32)
Calcium: 9.4 mg/dL (ref 8.4–10.5)
Chloride: 103 mEq/L (ref 96–112)
Creatinine, Ser: 1 mg/dL (ref 0.40–1.50)
GFR: 84.96 mL/min (ref >60.00)
Glucose, Bld: 94 mg/dL (ref 70–99)
POTASSIUM: 4.5 meq/L (ref 3.5–5.1)
SODIUM: 137 meq/L (ref 135–145)
TOTAL PROTEIN: 7.1 g/dL (ref 6.0–8.3)
Total Bilirubin: 0.6 mg/dL (ref 0.2–1.2)

## 2018-03-16 LAB — LIPID PANEL
Cholesterol: 234 mg/dL — ABNORMAL HIGH (ref 0–200)
HDL: 39.5 mg/dL (ref >39.00)
LDL Cholesterol: 164 mg/dL — ABNORMAL HIGH (ref 0–99)
NonHDL: 194.86
Total CHOL/HDL Ratio: 6
Triglycerides: 156 mg/dL — ABNORMAL HIGH (ref 0.0–149.0)
VLDL: 31.2 mg/dL (ref 0.0–40.0)

## 2018-03-16 LAB — CBC WITH DIFFERENTIAL/PLATELET
BASOS PCT: 0.9 % (ref 0.0–3.0)
Basophils Absolute: 0.1 10*3/uL (ref 0.0–0.1)
EOS PCT: 6.2 % — AB (ref 0.0–5.0)
Eosinophils Absolute: 0.4 10*3/uL (ref 0.0–0.7)
HCT: 46.7 % (ref 39.0–52.0)
Hemoglobin: 16 g/dL (ref 13.0–17.0)
LYMPHS ABS: 2 10*3/uL (ref 0.7–4.0)
Lymphocytes Relative: 33.1 % (ref 12.0–46.0)
MCHC: 34.3 g/dL (ref 30.0–36.0)
MCV: 86.9 fl (ref 78.0–100.0)
Monocytes Absolute: 0.5 10*3/uL (ref 0.1–1.0)
Monocytes Relative: 8.3 % (ref 3.0–12.0)
NEUTROS PCT: 51.5 % (ref 43.0–77.0)
Neutro Abs: 3 10*3/uL (ref 1.4–7.7)
Platelets: 257 10*3/uL (ref 150.0–400.0)
RBC: 5.37 Mil/uL (ref 4.22–5.81)
RDW: 12.8 % (ref 11.5–15.5)
WBC: 5.9 10*3/uL (ref 4.0–10.5)

## 2018-03-16 LAB — TSH: TSH: 2.37 u[IU]/mL (ref 0.35–4.50)

## 2018-03-16 MED ORDER — ALBUTEROL SULFATE HFA 108 (90 BASE) MCG/ACT IN AERS: INHALATION_SPRAY | RESPIRATORY_TRACT | 6 refills | Status: AC

## 2018-03-16 MED ORDER — BUDESONIDE 180 MCG/ACT IN AEPB: INHALATION_SPRAY | RESPIRATORY_TRACT | 6 refills | Status: AC

## 2018-03-16 NOTE — Progress Notes (Signed)
   Subjective:    Patient ID: Roy Griffin, male    DOB: 12/28/1970, 47 y.o.   MRN: [id number]  HPI  47 year old patient who is seen today for his annual preventive health examination  He continues to do well.  He does have a history of mild uncomplicated asthma and allergic rhinitis.  Remains on Pulmicort maintenance and has done quite well.  He had slight albuterol use during the spring but in general does quite well.  He has recently joined a health club and is exercising more regularly.  He has had a goal weight of 165.  He does have a history of mild dyslipidemia  Family history Father died young of an acute MI.  Mother has hypertension and cerebrovascular disease and a secondary seizure disorder.  Social history twin daughters age 45 divorced.  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  HENT: Negative for congestion, dental problem, ear pain, hearing loss, sore throat, tinnitus, trouble swallowing and voice change.   Eyes: Negative for pain, discharge and visual disturbance.  Respiratory: Negative for cough, chest tightness, wheezing and stridor.   Cardiovascular: Negative for chest pain, palpitations and leg swelling.  Gastrointestinal: Negative for abdominal distention, abdominal pain, blood in stool, constipation, diarrhea, nausea and vomiting.  Genitourinary: Negative for difficulty urinating, discharge, flank pain, genital sores, hematuria and urgency.  Musculoskeletal: Negative for arthralgias, back pain, gait problem, joint swelling, myalgias and neck stiffness.  Skin: Negative for rash.  Neurological: Negative for dizziness, syncope, speech difficulty, weakness, numbness and headaches.  Hematological: Negative for adenopathy. Does not bruise/bleed easily.  Psychiatric/Behavioral: Negative for behavioral problems  and dysphoric mood. The patient is not nervous/anxious.        Objective:   Physical Exam  Constitutional: He is oriented to person, place, and time. He appears well-developed and well-nourished. No distress.  Weight 195 Blood pressure low normal  HENT:  Head: Normocephalic.  Right Ear: External ear normal.  Left Ear: External ear normal.  Eyes: Conjunctivae and EOM are normal.  Neck: Normal range of motion.  Cardiovascular: Normal rate and normal heart sounds.  Pulmonary/Chest: Breath sounds normal.  Abdominal: Bowel sounds are normal.  Genitourinary: Penis normal.  Musculoskeletal: Normal range of motion. He exhibits no edema or tenderness.  Neurological: He is alert and oriented to person, place, and time.  Skin: Skin is warm and dry.  Psychiatric: He has a normal mood and affect. His behavior is normal.          Assessment & Plan:   Preventive health examination Mild dyslipidemia.  Dietary information dispensed exercise regimen encouraged Mild asthma stable continue Pulmicort medications updated  Review screening lab Follow-up 1 year or as needed  Marletta Lor

## 2018-03-16 NOTE — Patient Instructions (Addendum)
Health Maintenance, Male A healthy lifestyle and preventive care is important for your health and wellness. Ask your health care provider about what schedule of regular examinations is right for you. What should I know about weight and diet? Eat a Healthy Diet  Eat plenty of vegetables, fruits, whole grains, low-fat dairy products, and lean protein.  Do not eat a lot of foods high in solid fats, added sugars, or salt.  Maintain a Healthy Weight Regular exercise can help you achieve or maintain a healthy weight. You should:  Do at least 150 minutes of exercise each week. The exercise should increase your heart rate and make you sweat (moderate-intensity exercise).  Do strength-training exercises at least twice a week.  Watch Your Levels of Cholesterol and Blood Lipids  Have your blood tested for lipids and cholesterol every 5 years starting at 47 years of age. If you are at high risk for heart disease, you should start having your blood tested when you are 47 years old. You may need to have your cholesterol levels checked more often if: ? Your lipid or cholesterol levels are high. ? You are older than 47 years of age. ? You are at high risk for heart disease.  What should I know about cancer screening? Many types of cancers can be detected early and may often be prevented. Lung Cancer  You should be screened every year for lung cancer if: ? You are a current smoker who has smoked for at least 30 years. ? You are a former smoker who has quit within the past 15 years.  Talk to your health care provider about your screening options, when you should start screening, and how often you should be screened.  Colorectal Cancer  Routine colorectal cancer screening usually begins at 47 years of age and should be repeated every 5-10 years until you are 47 years old. You may need to be screened more often if early forms of precancerous polyps or small growths are found. Your health care provider  may recommend screening at an earlier age if you have risk factors for colon cancer.  Your health care provider may recommend using home test kits to check for hidden blood in the stool.  A small camera at the end of a tube can be used to examine your colon (sigmoidoscopy or colonoscopy). This checks for the earliest forms of colorectal cancer.  Prostate and Testicular Cancer  Depending on your age and overall health, your health care provider may do certain tests to screen for prostate and testicular cancer.  Talk to your health care provider about any symptoms or concerns you have about testicular or prostate cancer.  Skin Cancer  Check your skin from head to toe regularly.  Tell your health care provider about any new moles or changes in moles, especially if: ? There is a change in a mole's size, shape, or color. ? You have a mole that is larger than a pencil eraser.  Always use sunscreen. Apply sunscreen liberally and repeat throughout the day.  Protect yourself by wearing long sleeves, pants, a wide-brimmed hat, and sunglasses when outside.  What should I know about heart disease, diabetes, and high blood pressure?  If you are 18-39 years of age, have your blood pressure checked every 3-5 years. If you are 40 years of age or older, have your blood pressure checked every year. You should have your blood pressure measured twice-once when you are at a hospital or clinic, and once when   you are not at a hospital or clinic. Record the average of the two measurements. To check your blood pressure when you are not at a hospital or clinic, you can use: ? An automated blood pressure machine at a pharmacy. ? A home blood pressure monitor.  Talk to your health care provider about your target blood pressure.  If you are between 46-36 years old, ask your health care provider if you should take aspirin to prevent heart disease.  Have regular diabetes screenings by checking your fasting blood  sugar level. ? If you are at a normal weight and have a low risk for diabetes, have this test once every three years after the age of 70. ? If you are overweight and have a high risk for diabetes, consider being tested at a younger age or more often.  A one-time screening for abdominal aortic aneurysm (AAA) by ultrasound is recommended for men aged 30-75 years who are current or former smokers. What should I know about preventing infection? Hepatitis B If you have a higher risk for hepatitis B, you should be screened for this virus. Talk with your health care provider to find out if you are at risk for hepatitis B infection. Hepatitis C Blood testing is recommended for:  Everyone born from [birth year] through 1965.  Anyone with known risk factors for hepatitis C.  Sexually Transmitted Diseases (STDs)  You should be screened each year for STDs including gonorrhea and chlamydia if: ? You are sexually active and are younger than 47 years of age. ? You are older than 47 years of age and your health care provider tells you that you are at risk for this type of infection. ? Your sexual activity has changed since you were last screened and you are at an increased risk for chlamydia or gonorrhea. Ask your health care provider if you are at risk.  Talk with your health care provider about whether you are at high risk of being infected with HIV. Your health care provider may recommend a prescription medicine to help prevent HIV infection.  What else can I do?  Schedule regular health, dental, and eye exams.  Stay current with your vaccines (immunizations).  Do not use any tobacco products, such as cigarettes, chewing tobacco, and e-cigarettes. If you need help quitting, ask your health care provider.  Limit alcohol intake to no more than 2 drinks per day. One drink equals 12 ounces of beer, 5 ounces of wine, or 1 ounces of hard liquor.  Do not use street drugs.  Do not share needles.  Ask your  health care provider for help if you need support or information about quitting drugs.  Tell your health care provider if you often feel depressed.  Tell your health care provider if you have ever been abused or do not feel safe at home. This information is not intended to replace advice given to you by your health care provider. Make sure you discuss any questions you have with your health care provider. Document Released: 01/02/2008 Document Revised: 03/04/2016 Document Reviewed: 04/09/2015 Elsevier Interactive Patient Education  2018 [place] Heart-healthy meal planning includes:  Limiting unhealthy fats.  Increasing healthy fats.  Making other small dietary changes.  You may need to talk with your doctor or a diet specialist (dietitian) to create an eating plan that is right for you. What types of fat should I choose?  Choose healthy fats. These include olive oil and canola oil, flaxseeds,  walnuts, almonds, and seeds.  Eat more omega-3 fats. These include salmon, mackerel, sardines, tuna, flaxseed oil, and ground flaxseeds. Try to eat fish at least twice each week.  Limit saturated fats. ? Saturated fats are often found in animal products, such as meats, butter, and cream. ? Plant sources of saturated fats include palm oil, palm kernel oil, and coconut oil.  Avoid foods with partially hydrogenated oils in them. These include stick margarine, some tub margarines, cookies, crackers, and other baked goods. These contain trans fats. What general guidelines do I need to follow?  Check food labels carefully. Identify foods with trans fats or high amounts of saturated fat.  Fill one half of your plate with vegetables and green salads. Eat 4-5 servings of vegetables per day. A serving of vegetables is: ? 1 cup of raw leafy vegetables. ?  cup of raw or cooked cut-up vegetables. ?  cup of vegetable juice.  Fill one fourth of your plate with whole  grains. Look for the word "whole" as the first word in the ingredient list.  Fill one fourth of your plate with lean protein foods.  Eat 4-5 servings of fruit per day. A serving of fruit is: ? One medium whole fruit. ?  cup of dried fruit. ?  cup of fresh, frozen, or canned fruit. ?  cup of 100% fruit juice.  Eat more foods that contain soluble fiber. These include apples, broccoli, carrots, beans, peas, and barley. Try to get 20-30 g of fiber per day.  Eat more home-cooked food. Eat less restaurant, buffet, and fast food.  Limit or avoid alcohol.  Limit foods high in starch and sugar.  Avoid fried foods.  Avoid frying your food. Try baking, boiling, grilling, or broiling it instead. You can also reduce fat by: ? Removing the skin from poultry. ? Removing all visible fats from meats. ? Skimming the fat off of stews, soups, and gravies before serving them. ? Steaming vegetables in water or broth.  Lose weight if you are overweight.  Eat 4-5 servings of nuts, legumes, and seeds per week: ? One serving of dried beans or legumes equals  cup after being cooked. ? One serving of nuts equals 1 ounces. ? One serving of seeds equals  ounce or one tablespoon.  You may need to keep track of how much salt or sodium you eat. This is especially true if you have high blood pressure. Talk with your doctor or dietitian to get more information. What foods can I eat? Grains Breads, including [place], white, pita, wheat, raisin, rye, oatmeal, and [place]. Tortillas that are neither fried nor made with lard or trans fat. Low-fat rolls, including hotdog and hamburger buns and English muffins. Biscuits. Muffins. Waffles. Pancakes. Light popcorn. Whole-grain cereals. Flatbread. Melba toast. Pretzels. Breadsticks. Rusks. Low-fat snacks. Low-fat crackers, including oyster, saltine, matzo, graham, animal, and rye. Rice and pasta, including brown rice and pastas that are made with whole  wheat. Vegetables All vegetables. Fruits All fruits, but limit coconut. Meats and Other Protein Sources Lean, well-trimmed beef, veal, pork, and lamb. Chicken and [place] without skin. All fish and shellfish. Wild duck, rabbit, pheasant, and venison. Egg whites or low-cholesterol egg substitutes. Dried beans, peas, lentils, and tofu. Seeds and most nuts. Dairy Low-fat or nonfat cheeses, including ricotta, string, and mozzarella. Skim or 1% milk that is liquid, powdered, or evaporated. Buttermilk that is made with low-fat milk. Nonfat or low-fat yogurt. Beverages Mineral water. Diet carbonated beverages. Sweets and Desserts Sherbets  and fruit ices. Honey, jam, marmalade, jelly, and syrups. Meringues and gelatins. Pure sugar candy, such as hard candy, jelly beans, gumdrops, mints, marshmallows, and small amounts of dark chocolate. [employer] Eat all sweets and desserts in moderation. Fats and Oils Nonhydrogenated (trans-free) margarines. Vegetable oils, including soybean, sesame, sunflower, olive, peanut, safflower, corn, canola, and cottonseed. Salad dressings or mayonnaise made with a vegetable oil. Limit added fats and oils that you use for cooking, baking, salads, and as spreads. Other Cocoa powder. Coffee and tea. All seasonings and condiments. The items listed above may not be a complete list of recommended foods or beverages. Contact your dietitian for more options. What foods are not recommended? Grains Breads that are made with saturated or trans fats, oils, or whole milk. Croissants. Butter rolls. Cheese breads. Sweet rolls. Donuts. Buttered popcorn. Chow mein noodles. High-fat crackers, such as cheese or butter crackers. Meats and Other Protein Sources Fatty meats, such as hotdogs, short ribs, sausage, spareribs, bacon, rib eye roast or steak, and mutton. High-fat deli meats, such as salami and bologna. Caviar. Domestic duck and goose. Organ meats, such as kidney, liver,  sweetbreads, and heart. Dairy Cream, sour cream, cream cheese, and creamed cottage cheese. Whole-milk cheeses, including blue (bleu), Monterey Jack, [place], [place], American, [place], Swiss, cheddar, [place], and [place]. Whole or 2% milk that is liquid, evaporated, or condensed. Whole buttermilk. Cream sauce or high-fat cheese sauce. Yogurt that is made from whole milk. Beverages Regular sodas and juice drinks with added sugar. Sweets and Desserts Frosting. Pudding. Cookies. Cakes other than angel food cake. Candy that has milk chocolate or white chocolate, hydrogenated fat, butter, coconut, or unknown ingredients. Buttered syrups. Full-fat ice cream or ice cream drinks. Fats and Oils Gravy that has suet, meat fat, or shortening. Cocoa butter, hydrogenated oils, palm oil, coconut oil, palm kernel oil. These can often be found in baked products, candy, fried foods, nondairy creamers, and whipped toppings. Solid fats and shortenings, including bacon fat, salt pork, lard, and butter. Nondairy cream substitutes, such as coffee creamers and sour cream substitutes. Salad dressings that are made of unknown oils, cheese, or sour cream. The items listed above may not be a complete list of foods and beverages to avoid. Contact your dietitian for more information. This information is not intended to replace advice given to you by your health care provider. Make sure you discuss any questions you have with your health care provider. Document Released: 01/05/2012 Document Revised: 12/12/2015 Document Reviewed: 12/28/2013 Elsevier Interactive Patient Education  2018 [place] refers to food and lifestyle choices that are based on the traditions of countries located on the [employer]. This way of eating has been shown to help prevent certain conditions and improve outcomes for people who have chronic diseases, like kidney disease and heart disease. What are tips  for following this plan? Lifestyle  Cook and eat meals together with your family, when possible.  Drink enough fluid to keep your urine clear or pale yellow.  Be physically active every day. This includes: ? Aerobic exercise like running or swimming. ? Leisure activities like gardening, walking, or housework.  Get 7-8 hours of sleep each night.  If recommended by your health care provider, drink red wine in moderation. This means 1 glass a day for nonpregnant women and 2 glasses a day for men. A glass of wine equals 5 oz (150 mL). Reading food labels  Check the serving size of packaged foods.  For foods such as rice and pasta, the serving size refers to the amount of cooked product, not dry.  Check the total fat in packaged foods. Avoid foods that have saturated fat or trans fats.  Check the ingredients list for added sugars, such as corn syrup. Shopping  At the grocery store, buy most of your food from the areas near the walls of the store. This includes: ? Fresh fruits and vegetables (produce). ? Grains, beans, nuts, and seeds. Some of these may be available in unpackaged forms or large amounts (in bulk). ? Fresh seafood. ? Poultry and eggs. ? Low-fat dairy products.  Buy whole ingredients instead of prepackaged foods.  Buy fresh fruits and vegetables in-season from local farmers markets.  Buy frozen fruits and vegetables in resealable bags.  If you do not have access to quality fresh seafood, buy precooked frozen shrimp or canned fish, such as tuna, salmon, or sardines.  Buy small amounts of raw or cooked vegetables, salads, or olives from the deli or salad bar at your store.  Stock your pantry so you always have certain foods on hand, such as olive oil, canned tuna, canned tomatoes, rice, pasta, and beans. Cooking  Cook foods with extra-virgin olive oil instead of using butter or other vegetable oils.  Have meat as a side dish, and have vegetables or grains as your  main dish. This means having meat in small portions or adding small amounts of meat to foods like pasta or stew.  Use beans or vegetables instead of meat in common dishes like chili or lasagna.  Experiment with different cooking methods. Try roasting or broiling vegetables instead of steaming or sauteing them.  Add frozen vegetables to soups, stews, pasta, or rice.  Add nuts or seeds for added healthy fat at each meal. You can add these to yogurt, salads, or vegetable dishes.  Marinate fish or vegetables using olive oil, lemon juice, garlic, and fresh herbs. Meal planning  Plan to eat 1 vegetarian meal one day each week. Try to work up to 2 vegetarian meals, if possible.  Eat seafood 2 or more times a week.  Have healthy snacks readily available, such as: ? Vegetable sticks with hummus. ? [place] yogurt. ? Fruit and nut trail mix.  Eat balanced meals throughout the week. This includes: ? Fruit: 2-3 servings a day ? Vegetables: 4-5 servings a day ? Low-fat dairy: 2 servings a day ? Fish, poultry, or lean meat: 1 serving a day ? Beans and legumes: 2 or more servings a week ? Nuts and seeds: 1-2 servings a day ? Whole grains: 6-8 servings a day ? Extra-virgin olive oil: 3-4 servings a day  Limit red meat and sweets to only a few servings a month What are my food choices?  Mediterranean diet ? Recommended ? Grains: Whole-grain pasta. Brown rice. Bulgar wheat. Polenta. Couscous. Whole-wheat bread. [place]. ? Vegetables: Artichokes. Beets. Broccoli. Cabbage. Carrots. Eggplant. Green beans. Chard. Kale. Spinach. Onions. Leeks. Peas. Squash. Tomatoes. Peppers. Radishes. ? Fruits: Apples. Apricots. Avocado. Berries. Bananas. Cherries. Dates. Figs. Grapes. Lemons. Melon. Oranges. Peaches. Plums. Pomegranate. ? Meats and other protein foods: Beans. Almonds. Sunflower seeds. Pine nuts. Peanuts. [place]. Salmon. Scallops. Shrimp. [place]. Tilapia. Clams. Oysters. Eggs. ? Dairy: Low-fat  milk. Cheese. Greek yogurt. ? Beverages: Water. Red wine. Herbal tea. ? Fats and oils: Extra virgin olive oil. Avocado oil. Grape seed oil. ? Sweets and desserts: [place] yogurt with honey. Baked apples. Poached pears. Trail mix. ? Seasoning and  other foods: Basil. Cilantro. Coriander. Cumin. Mint. Parsley. Sage. Rosemary. Tarragon. Garlic. Oregano. Thyme. Pepper. Balsalmic vinegar. Tahini. Hummus. Tomato sauce. Olives. Mushrooms. ? Limit these ? Grains: Prepackaged pasta or rice dishes. Prepackaged cereal with added sugar. ? Vegetables: Deep fried potatoes (french fries). ? Fruits: Fruit canned in syrup. ? Meats and other protein foods: Beef. Pork. Lamb. Poultry with skin. Hot dogs. [place] Salines. ? Dairy: Ice cream. Sour cream. Whole milk. ? Beverages: Juice. Sugar-sweetened soft drinks. Beer. Liquor and spirits. ? Fats and oils: Butter. Canola oil. Vegetable oil. Beef fat (tallow). Lard. ? Sweets and desserts: Cookies. Cakes. Pies. Candy. ? Seasoning and other foods: Mayonnaise. Premade sauces and marinades. ? The items listed may not be a complete list. Talk with your dietitian about what dietary choices are right for you. Summary  The Mediterranean diet includes both food and lifestyle choices.  Eat a variety of fresh fruits and vegetables, beans, nuts, seeds, and whole grains.  Limit the amount of red meat and sweets that you eat.  Talk with your health care provider about whether it is safe for you to drink red wine in moderation. This means 1 glass a day for nonpregnant women and 2 glasses a day for men. A glass of wine equals 5 oz (150 mL). This information is not intended to replace advice given to you by your health care provider. Make sure you discuss any questions you have with your health care provider. Document Released: 02/27/2016 Document Revised: 03/31/2016 Document Reviewed: 02/27/2016 Elsevier Interactive Patient Education  [employer].

## 2018-11-30 ENCOUNTER — Telehealth: Payer: Self-pay | Admitting: Internal Medicine

## 2018-11-30 NOTE — Telephone Encounter (Signed)
Refills have been refused.  Patient will need TOC for further refills.  Patient is aware.

## 2018-11-30 NOTE — Telephone Encounter (Signed)
Copied from [place] [id number]. Topic: Quick Communication - Rx Refill/Question >> Nov 30, 2018  7:39 AM Robina Ade, Helene Kelp D wrote: Medication: betamethasone dipropionate (DIPROLENE) 0.05 % cream, Tadalafil 20 MG   Has the patient contacted their pharmacy? yes (Agent: If no, request that the patient contact the pharmacy for the refill.) (Agent: If yes, when and what did the pharmacy advise?)  Preferred Pharmacy (with phone number or street name): [facility], [place]  Agent: Please be advised that RX refills may take up to 3 business days. We ask that you follow-up with your pharmacy.

## 2019-04-04 ENCOUNTER — Ambulatory Visit (INDEPENDENT_AMBULATORY_CARE_PROVIDER_SITE_OTHER): Payer: 59 | Admitting: Podiatry

## 2019-04-04 ENCOUNTER — Other Ambulatory Visit: Payer: Self-pay | Admitting: Podiatry

## 2019-04-04 ENCOUNTER — Telehealth: Payer: Self-pay | Admitting: *Deleted

## 2019-04-04 ENCOUNTER — Ambulatory Visit (INDEPENDENT_AMBULATORY_CARE_PROVIDER_SITE_OTHER): Payer: 59

## 2019-04-04 ENCOUNTER — Other Ambulatory Visit: Payer: Self-pay

## 2019-04-04 ENCOUNTER — Encounter: Payer: Self-pay | Admitting: Podiatry

## 2019-04-04 VITALS — BP 112/87 | HR 84 | Resp 16

## 2019-04-04 DIAGNOSIS — Z01812 Encounter for preprocedural laboratory examination: Secondary | ICD-10-CM | POA: Diagnosis not present

## 2019-04-04 DIAGNOSIS — D492 Neoplasm of unspecified behavior of bone, soft tissue, and skin: Secondary | ICD-10-CM

## 2019-04-04 DIAGNOSIS — M7751 Other enthesopathy of right foot: Secondary | ICD-10-CM

## 2019-04-04 DIAGNOSIS — M778 Other enthesopathies, not elsewhere classified: Secondary | ICD-10-CM

## 2019-04-04 NOTE — Telephone Encounter (Signed)
Orders to J. Quintana, RN for pre-cert, faxed to [facility]. 

## 2019-04-04 NOTE — Progress Notes (Signed)
  Subjective:  Patient ID: Roy Griffin, male    DOB: Mar 26, 1971,  MRN: [id number] HPI Chief Complaint  Patient presents with  . Toe Pain    Hallux right - swollen toe, episodes of pain, redness and swelling x 1 year, usually just soaks and ices when this happens, takes Ibuprofen as needed  . New Patient (Initial Visit)    48 y.o. male presents with the above complaint.   ROS: He denies fever chills nausea vomiting muscle aches pains calf pain back pain chest pain shortness of breath headaches.  He does relate similar erythema and edema to the base of his thumb at the metacarpal carpal articulation  Past Medical History:  Diagnosis Date  . Allergic rhinitis   . Asthma   . Hyperlipidemia   . Tobacco abuse    Past Surgical History:  Procedure Laterality Date  . ACL reconstructive surgery  2000  . angle [place] repair  2008  . FACIAL COSMETIC SURGERY  1990  . Peyronie's disease      Current Outpatient Medications:  .  albuterol (VENTOLIN HFA) 108 (90 Base) MCG/ACT inhaler, INHALE 2 PUFFS BY MOUTH   EVERY 6 HOURS AS NEEDED FOR WHEEZING, Disp: 72 g, Rfl: 6 .  budesonide (PULMICORT FLEXHALER) 180 MCG/ACT inhaler, INHALE 2 PUFFS BY MOUTH TWO TIMES DAILY, Disp: 3 each, Rfl: 6 .  cetirizine (ZYRTEC) 10 MG tablet, Take 10 mg by mouth as needed for allergies., Disp: , Rfl:  .  CIALIS 20 MG tablet, TAKE 1/2 TO 1 TABLET BY  MOUTH EVERY OTHER DAY AS  NEEDED FOR ERECTILE  DYSFUNCTION, Disp: 15 tablet, Rfl: 1 .  fluticasone (FLONASE ALLERGY RELIEF) 50 MCG/ACT nasal spray, Place 1 spray into both nostrils as needed. , Disp: , Rfl:   Allergies  Allergen Reactions  . Penicillins    Review of Systems Objective:   Vitals:   04/04/19 1128  BP: 112/87  Pulse: 84  Resp: 16    General: Well developed, nourished, in no acute distress, alert and oriented x3   Dermatological: Skin is warm, dry and supple bilateral. Nails x 10 are well maintained; remaining integument appears unremarkable at this  time. There are no open sores, no preulcerative lesions, no rash or signs of infection present.  Vascular: Dorsalis Pedis artery and Posterior Tibial artery pedal pulses are 2/4 bilateral with immedate capillary fill time. Pedal hair growth present. No varicosities and no lower extremity edema present bilateral.   Neruologic: Grossly intact via light touch bilateral. Vibratory intact via tuning fork bilateral. Protective threshold with Semmes Wienstein monofilament intact to all pedal sites bilateral. Patellar and Achilles deep tendon reflexes 2+ bilateral. No Babinski or clonus noted bilateral.   Musculoskeletal: No gross boney pedal deformities bilateral. No pain, crepitus, or limitation noted with foot and ankle range of motion bilateral. Muscular strength 5/5 in all groups tested bilateral. Firm mildly warm nodular nonpulsitile mass over HIPJ no ulcerations  Gait: Unassisted, Nonantalgic.    Radiographs:  Radiographs taken today demonstrate soft tissue swelling at the hallux interphalangeal joint and just distal to that area.  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 MRI foot right.     Max T. Sumner, [place]

## 2019-04-04 NOTE — Telephone Encounter (Signed)
-----   Message from Rip Harbour, [facility] sent at 04/04/2019 11:49 AM EDT ----- Regarding: MRI MRI with contrast right hallux - evaluate soft tissue tumor of unknown origin - surgical consideration  Patient was given labs for pre-imaging

## 2019-04-06 ENCOUNTER — Other Ambulatory Visit: Payer: Self-pay

## 2019-04-06 ENCOUNTER — Ambulatory Visit
Admission: RE | Admit: 2019-04-06 | Discharge: 2019-04-06 | Disposition: A | Discharge status: Home / Self Care | Payer: 59 | Source: Ambulatory Visit | Attending: Podiatry | Admitting: Podiatry

## 2019-04-06 DIAGNOSIS — M7751 Other enthesopathy of right foot: Secondary | ICD-10-CM

## 2019-04-06 DIAGNOSIS — D492 Neoplasm of unspecified behavior of bone, soft tissue, and skin: Secondary | ICD-10-CM

## 2019-04-06 LAB — CBC WITH DIFFERENTIAL/PLATELET
Absolute Monocytes: 511 cells/uL (ref 200–950)
Basophils Absolute: 70 cells/uL (ref 0–200)
Basophils Relative: 1 %
Eosinophils Absolute: 308 cells/uL (ref 15–500)
Eosinophils Relative: 4.4 %
HCT: 49.6 % (ref 38.5–50.0)
Hemoglobin: 16.8 g/dL (ref 13.2–17.1)
Lymphs Abs: 2149 cells/uL (ref 850–3900)
MCH: 29.6 pg (ref 27.0–33.0)
MCHC: 33.9 g/dL (ref 32.0–36.0)
MCV: 87.3 fL (ref 80.0–100.0)
MPV: 10.9 fL (ref 7.5–12.5)
Monocytes Relative: 7.3 %
Neutro Abs: 3962 cells/uL (ref 1500–7800)
Neutrophils Relative %: 56.6 %
Platelets: 274 10*3/uL (ref 140–400)
RBC: 5.68 10*6/uL (ref 4.20–5.80)
RDW: 12.2 % (ref 11.0–15.0)
Total Lymphocyte: 30.7 %
WBC: 7 10*3/uL (ref 3.8–10.8)

## 2019-04-06 LAB — BUN/CREATININE RATIO
BUN: 9 mg/dL (ref 7–25)
Creat: 0.93 mg/dL (ref 0.60–1.35)
GFR, Est African American: 112 mL/min/{1.73_m2} (ref >=60)
GFR, Est Non African American: 97 mL/min/{1.73_m2} (ref >=60)

## 2019-04-06 LAB — C-REACTIVE PROTEIN: CRP: 1.2 mg/L (ref <8.0)

## 2019-04-06 LAB — SEDIMENTATION RATE: Sed Rate: 2 mm/h (ref 0–15)

## 2019-04-06 LAB — ANTI-NUCLEAR AB-TITER (ANA TITER)
ANA TITER: 1:80 {titer} — ABNORMAL HIGH
ANA Titer 1: 1:80 {titer} — ABNORMAL HIGH

## 2019-04-06 LAB — ANA: Anti Nuclear Antibody (ANA): POSITIVE — AB

## 2019-04-06 LAB — URIC ACID: Uric Acid, Serum: 8.2 mg/dL — ABNORMAL HIGH (ref 4.0–8.0)

## 2019-04-06 LAB — RHEUMATOID FACTOR: Rheumatoid fact SerPl-aCnc: <14 IU/mL (ref <14)

## 2019-04-06 MED ORDER — GADOBENATE DIMEGLUMINE 529 MG/ML IV SOLN
17.0000 mL | Freq: Once | INTRAVENOUS | Status: AC | PRN
  Administered 2019-04-06: 17 mL via INTRAVENOUS

## 2019-04-11 ENCOUNTER — Telehealth: Payer: Self-pay | Admitting: *Deleted

## 2019-04-11 DIAGNOSIS — M7751 Other enthesopathy of right foot: Secondary | ICD-10-CM

## 2019-04-11 DIAGNOSIS — D492 Neoplasm of unspecified behavior of bone, soft tissue, and skin: Secondary | ICD-10-CM

## 2019-04-11 NOTE — Telephone Encounter (Signed)
-----   Message from Garrel Ridgel, [place] sent at 04/11/2019  7:50 AM EDT ----- Please send for over read and second opinion.  Inform patient of the delay

## 2019-04-11 NOTE — Telephone Encounter (Signed)
I informed pt of Dr. Stephenie Acres review of results and request to send copy of MRI disc to a radiology specialist for more details to plan treatment, there would be a 10 -14 day delay for final results and once received we would give him a call with instructions.

## 2019-04-11 NOTE — Telephone Encounter (Signed)
Faxed request for MRI disc copy to [facility]. 

## 2019-04-11 NOTE — Telephone Encounter (Signed)
Mailed copy of the MRI from [facility] to [facility].

## 2019-04-11 NOTE — Telephone Encounter (Signed)
-----   Message from Garrel Ridgel, [place] sent at 04/11/2019  7:45 AM EDT ----- He also has positive ANA and I would feel better if he were seen by rheumatology for the positive gout and ANA

## 2019-04-11 NOTE — Telephone Encounter (Signed)
I informed pt of Dr. Stephenie Acres review of results and referral to Rheumatology. Pt states understanding. Faxed required form, clinicals and demographics to [facility].

## 2019-04-26 ENCOUNTER — Encounter: Payer: Self-pay | Admitting: Podiatry

## 2019-05-03 ENCOUNTER — Telehealth: Payer: Self-pay | Admitting: *Deleted

## 2019-05-03 NOTE — Telephone Encounter (Signed)
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  May come in to discuss surgical options.

## 2019-05-03 NOTE — Telephone Encounter (Signed)
I informed pt of Dr. Stephenie Acres review of results and order. Pt states understanding and I transferred to schedulers and routed message for schedulers to call.

## 2019-05-25 ENCOUNTER — Ambulatory Visit (INDEPENDENT_AMBULATORY_CARE_PROVIDER_SITE_OTHER): Payer: 59 | Admitting: Podiatry

## 2019-05-25 ENCOUNTER — Other Ambulatory Visit: Payer: Self-pay

## 2019-05-25 ENCOUNTER — Encounter: Payer: Self-pay | Admitting: Podiatry

## 2019-05-25 DIAGNOSIS — D492 Neoplasm of unspecified behavior of bone, soft tissue, and skin: Secondary | ICD-10-CM

## 2019-05-25 NOTE — Progress Notes (Signed)
He presents today for follow-up of his mass to his first toe right foot.  He states that he saw Dr. Lenna Gilford rheumatology who put him on medication for gout.  He states that he seems to be doing pretty well at this time.  Objective: Vital signs are stable he is alert and oriented x3 physical exam of the right foot does not demonstrate an increase in size as a matter fact it looks much smaller on the medial aspect of the hallux interphalangeal joint right.  There is no erythema cellulitis drainage or odor mildly tender on palpation.  Pulses are palpable and strong.  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  Assessment: Soft tissue mass right hallux.  Plan: We discussed etiology pathology conservative versus surgical therapies.  At this point we discussed the pros and cons of surgery.  We discussed today and consented him for excision of soft tissue tumor possible hallux interphalangeal joint fusion with screw.  He understands this and is amenable to it did discuss the possible postop complications which may include but not limited to postop pain bleeding swelling infection recurrence need for further surgery overcorrection under correction loss of digit loss of limb loss of life.  Dispensed a Cam walker today dispensed both oral and written home-going instructions for the care and instructions of the surgery center and anesthesia group as well as instructions for the morning of surgery.  I will follow-up with him in the near future for surgical intervention.

## 2019-05-25 NOTE — Patient Instructions (Signed)
Pre-Operative Instructions  Congratulations, you have decided to take an important step towards improving your quality of life.  You can be assured that the doctors and staff at [facility] will be with you every step of the way.  Here are some important things you should know:  1. Plan to be at the surgery center/hospital at least 1 (one) hour prior to your scheduled time, unless otherwise directed by the surgical center/hospital staff.  You must have a responsible adult accompany you, remain during the surgery and drive you home.  Make sure you have directions to the surgical center/hospital to ensure you arrive on time. 2. If you are having surgery at [facility] or [facility], you will need a copy of your medical history and physical form from your family physician within one month prior to the date of surgery. We will give you a form for your primary physician to complete.  3. We make every effort to accommodate the date you request for surgery.  However, there are times where surgery dates or times have to be moved.  We will contact you as soon as possible if a change in schedule is required.   4. No aspirin/ibuprofen for one week before surgery.  If you are on aspirin, any non-steroidal anti-inflammatory medications (Mobic, Aleve, Ibuprofen) should not be taken seven (7) days prior to your surgery.  You make take Tylenol for pain prior to surgery.  5. Medications - If you are taking daily heart and blood pressure medications, seizure, reflux, allergy, asthma, anxiety, pain or diabetes medications, make sure you notify the surgery center/hospital before the day of surgery so they can tell you which medications you should take or avoid the day of surgery. 6. No food or drink after midnight the night before surgery unless directed otherwise by surgical center/hospital staff. 7. No alcoholic beverages 24-hours prior to surgery.  No smoking 24-hours prior or 24-hours after  surgery. 8. Wear loose pants or shorts. They should be loose enough to fit over bandages, boots, and casts. 9. Don't wear slip-on shoes. Sneakers are preferred. 10. Bring your boot with you to the surgery center/hospital.  Also bring crutches or a walker if your physician has prescribed it for you.  If you do not have this equipment, it will be provided for you after surgery. 11. If you have not been contacted by the surgery center/hospital by the day before your surgery, call to confirm the date and time of your surgery. 12. Leave-time from work may vary depending on the type of surgery you have.  Appropriate arrangements should be made prior to surgery with your employer. 13. Prescriptions will be provided immediately following surgery by your doctor.  Fill these as soon as possible after surgery and take the medication as directed. Pain medications will not be refilled on weekends and must be approved by the doctor. 14. Remove nail polish on the operative foot and avoid getting pedicures prior to surgery. 15. Wash the night before surgery.  The night before surgery wash the foot and leg well with water and the antibacterial soap provided. Be sure to pay special attention to beneath the toenails and in between the toes.  Wash for at least three (3) minutes. Rinse thoroughly with water and dry well with a towel.  Perform this wash unless told not to do so by your physician.  Enclosed: 1 Ice pack (please put in freezer the night before surgery)   1 Hibiclens skin cleaner     Pre-op instructions  If you have any questions regarding the instructions, please do not hesitate to call our office.  [place]: [address], [place], [place] [address] -- [phone]  [place]: [address] [place], [place] [address] -- [phone]  [place]: [address]  [place], [place] [address] -- [phone]   Website: [email or web address] 

## 2019-07-18 ENCOUNTER — Telehealth: Payer: Self-pay | Admitting: Podiatry

## 2019-07-18 NOTE — Telephone Encounter (Signed)
DOS: 07/28/2019  SURGICAL PROCEDURES: Excision Soft Tissue Tumor Hallux Right(28090) and  Hallux IPJ Fusion NGITJ(95974)  [employer] Effective 08/20/2018 - 01/17/2020  Deductible is $2,250 with $2,250 met and $0 remaining. Out of Pocket is $3,000 with $2,423.86 met and $576.14 remaining. CoInsurance 90% / 10%  Notification or Prior Authorization is not required for the requested services  This [employer] plan does not currently require a prior authorization for these services. If you have general questions about the prior authorization requirements, please call us at [phone] or visit [email or web address] > Clinician Resources > Advance and Admission Notification Requirements. The number above acknowledges your notification. Please write this number down for future reference. Notification is not a guarantee of coverage or payment.  Decision ID #:[id number]  The number above acknowledges your inquiry and our response. Please write this number down and refer to it for future inquiries. Coverage and payment for an item or service is governed by the member's benefit plan document, and, if applicable, the provider's participation agreement with the Health Plan.

## 2019-07-27 ENCOUNTER — Other Ambulatory Visit: Payer: Self-pay | Admitting: Podiatry

## 2019-07-27 MED ORDER — OXYCODONE-ACETAMINOPHEN 10-325 MG PO TABS: 1.0000 | ORAL_TABLET | Freq: Three times a day (TID) | ORAL | 0 refills | Status: AC | PRN

## 2019-07-27 MED ORDER — ONDANSETRON HCL 4 MG PO TABS: 4.0000 mg | ORAL_TABLET | Freq: Three times a day (TID) | ORAL | 0 refills | Status: DC | PRN

## 2019-07-27 MED ORDER — CLINDAMYCIN HCL 150 MG PO CAPS: 150.0000 mg | ORAL_CAPSULE | Freq: Three times a day (TID) | ORAL | 0 refills | Status: DC

## 2019-07-28 ENCOUNTER — Encounter: Payer: Self-pay | Admitting: Podiatry

## 2019-07-28 DIAGNOSIS — M2011 Hallux valgus (acquired), right foot: Secondary | ICD-10-CM

## 2019-07-28 DIAGNOSIS — M67471 Ganglion, right ankle and foot: Secondary | ICD-10-CM

## 2019-08-03 ENCOUNTER — Ambulatory Visit (INDEPENDENT_AMBULATORY_CARE_PROVIDER_SITE_OTHER): Payer: 59 | Admitting: Podiatry

## 2019-08-03 ENCOUNTER — Other Ambulatory Visit: Payer: Self-pay

## 2019-08-03 ENCOUNTER — Encounter: Payer: Self-pay | Admitting: Podiatry

## 2019-08-03 ENCOUNTER — Ambulatory Visit (INDEPENDENT_AMBULATORY_CARE_PROVIDER_SITE_OTHER): Payer: 59

## 2019-08-03 VITALS — BP 112/75 | HR 78 | Resp 16

## 2019-08-03 DIAGNOSIS — Z9889 Other specified postprocedural states: Secondary | ICD-10-CM

## 2019-08-03 DIAGNOSIS — M2031 Hallux varus (acquired), right foot: Secondary | ICD-10-CM

## 2019-08-03 DIAGNOSIS — D492 Neoplasm of unspecified behavior of bone, soft tissue, and skin: Secondary | ICD-10-CM

## 2019-08-03 NOTE — Progress Notes (Signed)
He presents today postop visit #1 date of surgery July 28, 2019 excision uric acid mass and with a hallux interphalangeal joint fusion right.  Denies fever chills nausea vomiting muscle aches pain states that is doing just great.  Objective: Vital signs are stable alert and oriented x3.  Pulses are palpable.  There is no erythema edema cellulitis drainage or odor sutures are intact margins are well coapted radiographs taken today demonstrate a hallux interphalangeal joint fusion with a headless screw.  Assessment: Well-healing surgical foot.  Plan: Redressed today dressed a compressive dressing follow-up with him in 1 week for possible suture removal.

## 2019-08-10 ENCOUNTER — Ambulatory Visit (INDEPENDENT_AMBULATORY_CARE_PROVIDER_SITE_OTHER): Payer: 59 | Admitting: Podiatry

## 2019-08-10 ENCOUNTER — Other Ambulatory Visit: Payer: Self-pay

## 2019-08-10 DIAGNOSIS — Z9889 Other specified postprocedural states: Secondary | ICD-10-CM | POA: Diagnosis not present

## 2019-08-10 NOTE — Progress Notes (Signed)
He presents today for a postop visit states that he has been doing very well no problems have a little stinging and burning but has continued to put Vaseline on the tip of the toe where the stitches were.  Objective: He presents today ambulating in his cam walker dry sterile dressing that I put on last week is still intact once removed demonstrates no erythema cellulitis drainage or odor incision site to the hallux right demonstrates sutures are intact margins well coapted I went ahead remove the sutures today margins remain well coapted appears to be healing very nicely.  The toe is still rectus and in good position.  Assessment: Well-healing surgical foot.  Plan: Roy Griffin ahead and remove the sutures today showed him how to dress the toe daily with Coban and I will follow-up with him in 2 weeks.  Should he have questions or concerns he will notify me immediately otherwise he will continue use of his Darco shoe which I provided for him today.

## 2019-08-24 ENCOUNTER — Encounter: Payer: Self-pay | Admitting: Podiatry

## 2019-08-24 ENCOUNTER — Ambulatory Visit (INDEPENDENT_AMBULATORY_CARE_PROVIDER_SITE_OTHER): Payer: 59 | Admitting: Podiatry

## 2019-08-24 ENCOUNTER — Other Ambulatory Visit: Payer: Self-pay

## 2019-08-24 ENCOUNTER — Ambulatory Visit (INDEPENDENT_AMBULATORY_CARE_PROVIDER_SITE_OTHER): Payer: 59

## 2019-08-24 DIAGNOSIS — M2031 Hallux varus (acquired), right foot: Secondary | ICD-10-CM | POA: Diagnosis not present

## 2019-08-24 DIAGNOSIS — Z9889 Other specified postprocedural states: Secondary | ICD-10-CM

## 2019-08-24 DIAGNOSIS — D492 Neoplasm of unspecified behavior of bone, soft tissue, and skin: Secondary | ICD-10-CM

## 2019-08-25 ENCOUNTER — Encounter: Payer: Self-pay | Admitting: Podiatry

## 2019-08-26 NOTE — Progress Notes (Signed)
He presents today date of surgery July 28, 2019 excision tophus hallux interphalangeal joint and fusion of the hallux interphalangeal joint of the right foot.  He states that is feeling okay.  Objective: Vital signs are stable he is alert and oriented x3.  There is no erythema edema cellulitis drainage or odor incision sites: Healed uneventfully.  Is been a month now and it looks fantastic.  Radiographically appears to be healing very nicely.  Assessment: I well-healing surgical foot.  Plan: Recommend that he continue wearing the Darco shoe for at least another couple of weeks to get him out at least 6 weeks and then try to get into a regular tennis shoe.  We will follow-up with him in about a month

## 2019-09-07 ENCOUNTER — Encounter: Payer: 59 | Admitting: Podiatry

## 2019-09-14 ENCOUNTER — Ambulatory Visit (INDEPENDENT_AMBULATORY_CARE_PROVIDER_SITE_OTHER): Payer: 59

## 2019-09-14 ENCOUNTER — Ambulatory Visit (INDEPENDENT_AMBULATORY_CARE_PROVIDER_SITE_OTHER): Payer: 59 | Admitting: Podiatry

## 2019-09-14 ENCOUNTER — Other Ambulatory Visit: Payer: Self-pay

## 2019-09-14 DIAGNOSIS — M2031 Hallux varus (acquired), right foot: Secondary | ICD-10-CM

## 2019-09-14 DIAGNOSIS — Z9889 Other specified postprocedural states: Secondary | ICD-10-CM

## 2019-09-14 DIAGNOSIS — D492 Neoplasm of unspecified behavior of bone, soft tissue, and skin: Secondary | ICD-10-CM

## 2019-09-14 NOTE — Progress Notes (Signed)
He presents today for postop visit date of surgery July 27, 2018 with hallux interphalangeal joint fusion right.  He denies fever chills nausea vomiting muscle aches and pains and states that is been doing just fine.  Objective: Vital signs are stable he is alert and oriented x3.  There is no erythema no edema cellulitis drainage or odor.  Radiographs taken today demonstrate consolidation at the fusion site.  Assessment: Well-healing surgical foot.  Plan: Follow-up with him on an as-needed basis.

## 2019-10-20 ENCOUNTER — Other Ambulatory Visit: Payer: Self-pay | Admitting: Rheumatology

## 2019-10-20 DIAGNOSIS — R7989 Other specified abnormal findings of blood chemistry: Secondary | ICD-10-CM

## 2019-10-25 ENCOUNTER — Ambulatory Visit
Admission: RE | Admit: 2019-10-25 | Discharge: 2019-10-25 | Disposition: A | Discharge status: Home / Self Care | Payer: 59 | Source: Ambulatory Visit | Attending: Rheumatology | Admitting: Rheumatology

## 2019-10-25 DIAGNOSIS — R7989 Other specified abnormal findings of blood chemistry: Secondary | ICD-10-CM

## 2021-11-11 IMAGING — US US ABDOMEN COMPLETE
1 series · 14 of 25 positions shown · non-contrast
Comparison: None.

CLINICAL DATA: Elevated LFTs

EXAM:
ABDOMEN ULTRASOUND COMPLETE

[Series 1: us abdomen complete · 0.28mm/px · 14 of 78 slices shown]
[im 1/78]
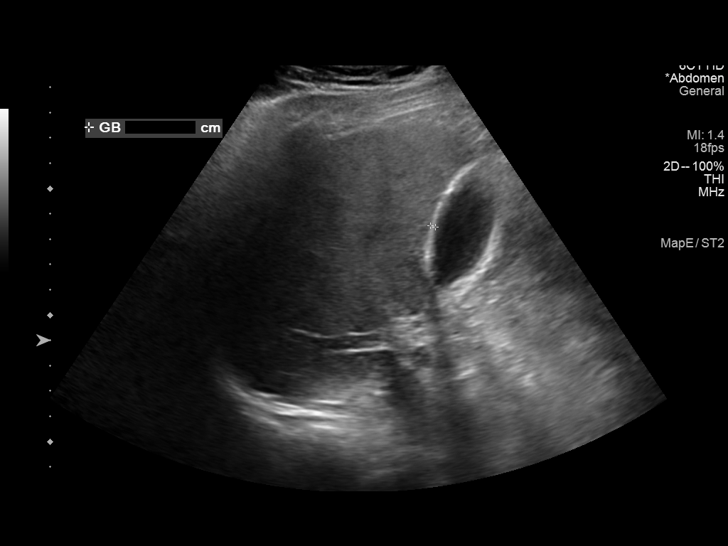
[im 7/78]
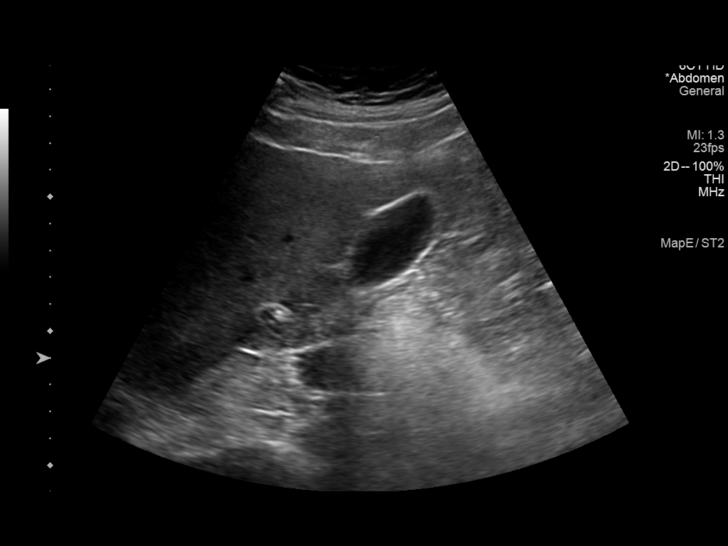
[im 13/78]
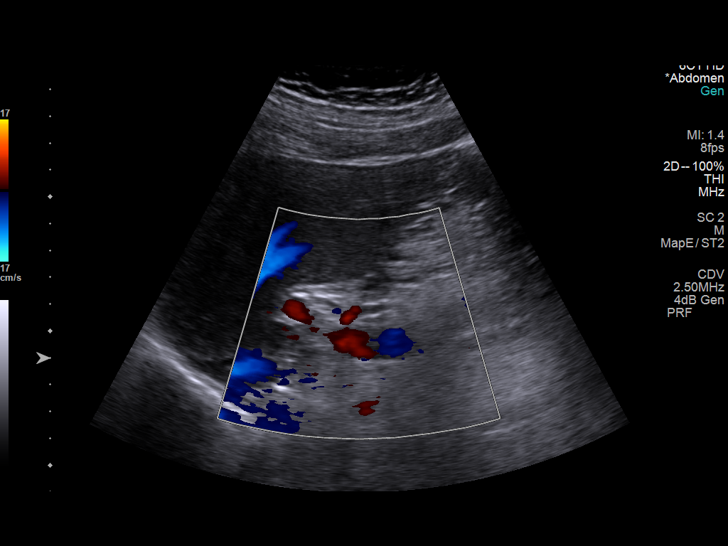
[im 20/78]
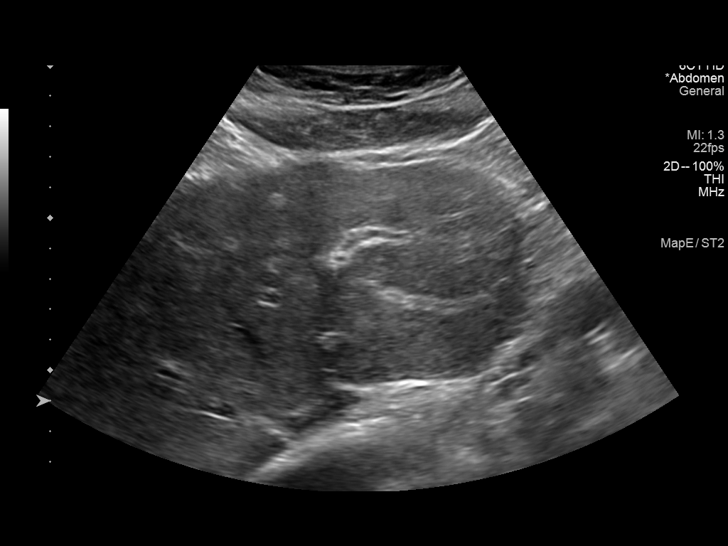
[im 26/78]
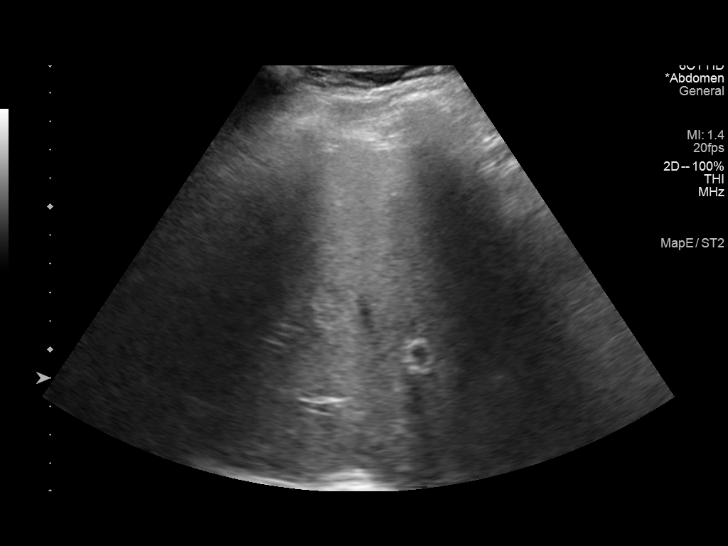
[im 29/78]
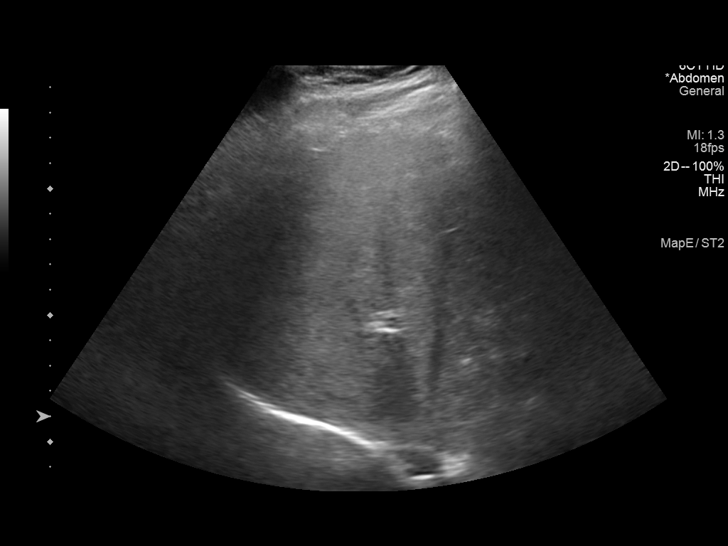
[im 36/78]
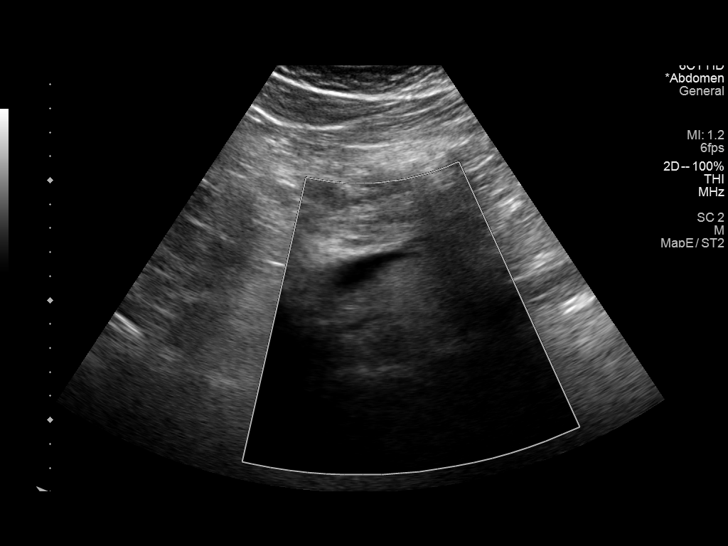
[im 42/78]
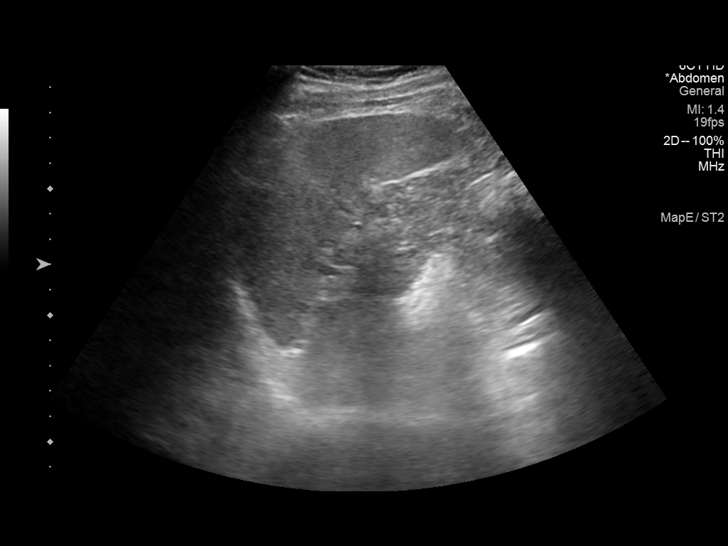
[im 49/78]
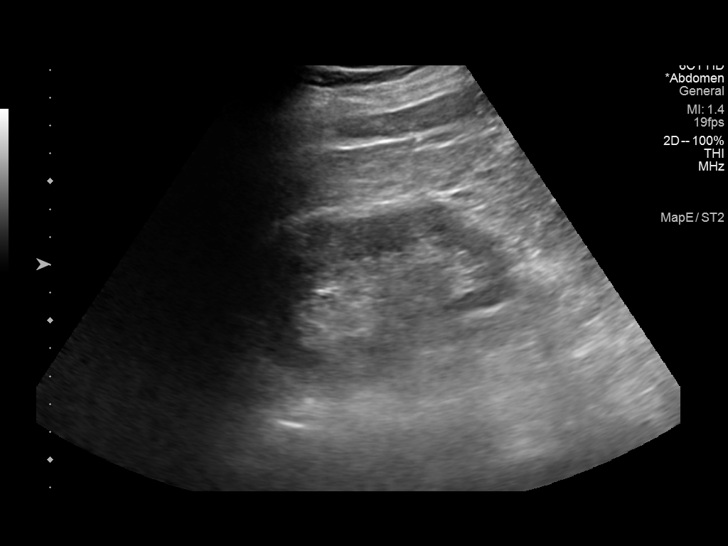
[im 52/78]
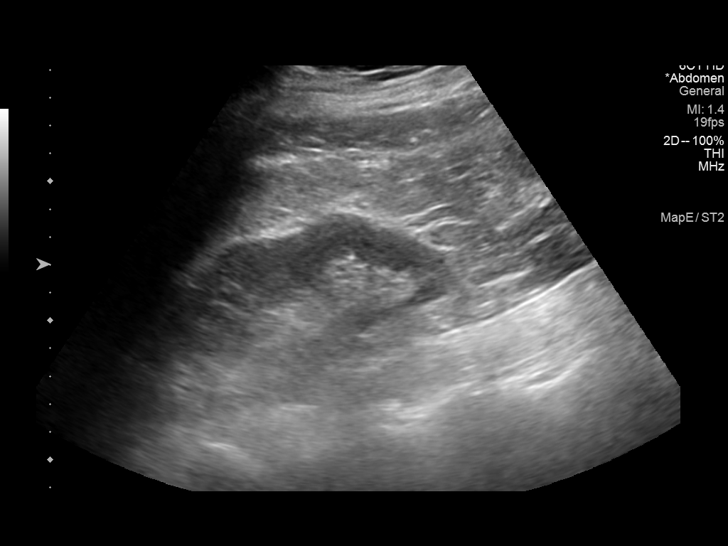
[im 58/78]
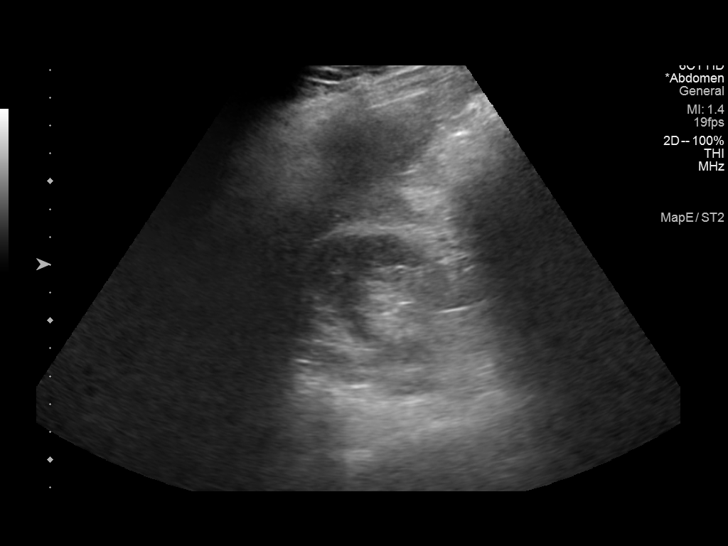
[im 65/78]
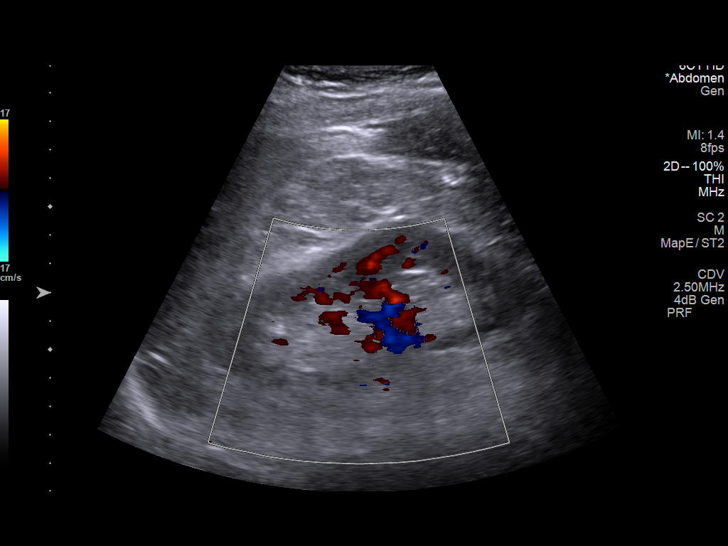
[im 71/78]
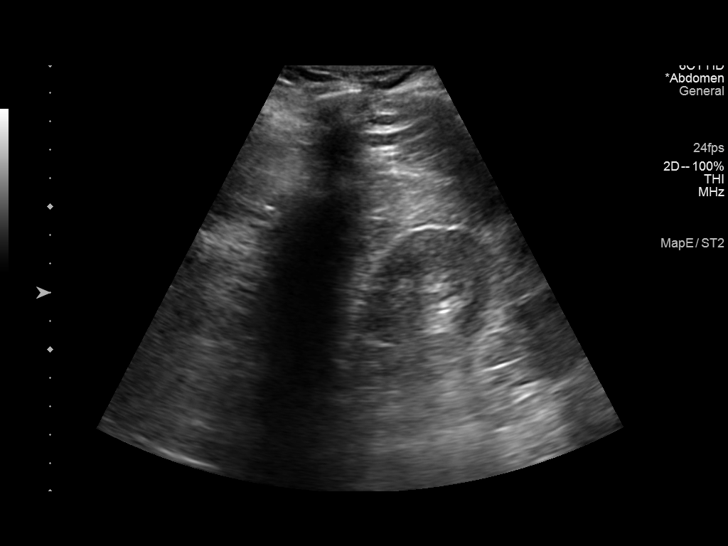
[im 78/78]
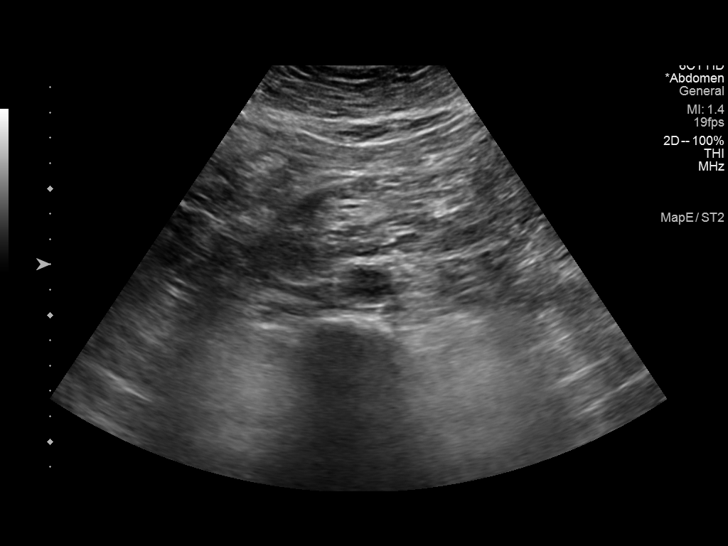

[14 of 25 positions shown; findings below may reference images not displayed]

FINDINGS: Gallbladder: No gallstones or wall thickening visualized. No
sonographic Murphy sign noted by sonographer.

Common bile duct: Diameter: 3 mm

Liver: No focal lesion identified. Diffusely increased hepatic
parenchymal echogenicity. Portal vein is patent on color Doppler
imaging with normal direction of blood flow towards the liver.

IVC: Obscured by bowel gas.

Pancreas: Largely obscured by bowel gas.

Spleen: Size and appearance within normal limits.

Right Kidney: Length: 10.4 cm. Echogenicity within normal limits. No
mass or hydronephrosis visualized.

Left Kidney: Length: 11.2 cm. Echogenicity within normal limits. No
mass or hydronephrosis visualized.

Abdominal aorta: No aneurysm visualized.

Other findings: None.
IMPRESSION: 1. The echogenicity of the liver is increased. This is a nonspecific
finding but is most commonly seen with fatty infiltration of the
liver. There are no obvious focal liver lesions.
2. IVC and pancreas for largely obscured by shadowing from overlying
bowel gas.
3. Remainder of the study is unremarkable.

## 2022-12-29 ENCOUNTER — Other Ambulatory Visit: Payer: Self-pay | Admitting: Urology

## 2022-12-29 DIAGNOSIS — R972 Elevated prostate specific antigen [PSA]: Secondary | ICD-10-CM

## 2023-02-15 ENCOUNTER — Ambulatory Visit
Admission: RE | Admit: 2023-02-15 | Discharge: 2023-02-15 | Disposition: A | Discharge status: Home / Self Care | Payer: Managed Care, Other (non HMO) | Source: Ambulatory Visit | Attending: Urology | Admitting: Urology

## 2023-02-15 DIAGNOSIS — R972 Elevated prostate specific antigen [PSA]: Secondary | ICD-10-CM

## 2023-02-15 MED ORDER — GADOPICLENOL 0.5 MMOL/ML IV SOLN
9.0000 mL | Freq: Once | INTRAVENOUS | Status: AC | PRN
  Administered 2023-02-15: 9 mL via INTRAVENOUS
